# Patient Record
Sex: Male | Born: 1995 | Race: White | Hispanic: No | Marital: Single | Smoking: Former smoker
Health system: Southern US, Community
[De-identification: ages and names within clinical notes are randomized; demographics above are authoritative.]

## PROBLEM LIST (undated history)

## (undated) DIAGNOSIS — Z8669 Personal history of other diseases of the nervous system and sense organs: Secondary | ICD-10-CM

## (undated) DIAGNOSIS — E669 Obesity, unspecified: Secondary | ICD-10-CM

## (undated) DIAGNOSIS — S83512A Sprain of anterior cruciate ligament of left knee, initial encounter: Secondary | ICD-10-CM

## (undated) DIAGNOSIS — M25552 Pain in left hip: Secondary | ICD-10-CM

## (undated) DIAGNOSIS — M25551 Pain in right hip: Secondary | ICD-10-CM

## (undated) DIAGNOSIS — J302 Other seasonal allergic rhinitis: Secondary | ICD-10-CM

## (undated) DIAGNOSIS — Z8709 Personal history of other diseases of the respiratory system: Secondary | ICD-10-CM

## (undated) DIAGNOSIS — J189 Pneumonia, unspecified organism: Secondary | ICD-10-CM

## (undated) DIAGNOSIS — J45909 Unspecified asthma, uncomplicated: Secondary | ICD-10-CM

## (undated) HISTORY — DX: Pain in left hip: M25.552

## (undated) HISTORY — DX: Personal history of other diseases of the nervous system and sense organs: Z86.69

## (undated) HISTORY — DX: Personal history of other diseases of the respiratory system: Z87.09

## (undated) HISTORY — DX: Other seasonal allergic rhinitis: J30.2

## (undated) HISTORY — DX: Obesity, unspecified: E66.9

## (undated) HISTORY — DX: Sprain of anterior cruciate ligament of left knee, initial encounter: S83.512A

## (undated) HISTORY — DX: Pain in right hip: M25.551

---
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
## 2014-04-15 ENCOUNTER — Emergency Department: Payer: Self-pay | Admitting: Internal Medicine

## 2014-04-25 ENCOUNTER — Emergency Department: Payer: Self-pay | Admitting: Emergency Medicine

## 2015-01-13 ENCOUNTER — Emergency Department
Admit: 2015-01-13 | Disposition: A | Discharge status: Home / Self Care | Payer: Self-pay | Admitting: Emergency Medicine

## 2015-01-25 ENCOUNTER — Other Ambulatory Visit: Payer: Self-pay | Admitting: Specialist

## 2015-01-25 DIAGNOSIS — S8392XD Sprain of unspecified site of left knee, subsequent encounter: Secondary | ICD-10-CM

## 2015-01-28 NOTE — Op Note (Signed)
PATIENT NAME:  Gregory CooleySHANNON, Jhon W MR#:  [id number] DATE OF BIRTH:  [birth year]  DATE OF PROCEDURE:  11/05/2011  PREOPERATIVE DIAGNOSIS: Pilonidal cyst with abscess.   POSTOPERATIVE DIAGNOSIS: Pilonidal cyst with abscess.   OPERATIVE PROCEDURE: Pilonidal cystectomy with primary closure.   SURGEON: Donnalee CurryJeffrey Evania Lyne, MD   ANESTHESIA: Attended local under Dr. Noralyn Pickarroll, 0.5% Xylocaine with 0.25% Marcaine with 1: 200,000 units of epinephrine and sodium bicarbonate: 65 mL local infiltration.   ESTIMATED BLOOD LOSS: Minimal.   CLINICAL NOTE: This 19 year old male had previously undergone incision and drainage of a pilonidal abscess with retrieval of a large amount of hair. The acute infection has become quiescent and he is brought to the operating room at this time for planned excision of the area.   OPERATIVE NOTE: With the patient comfortably prone on the operating table and the area clipped, the butt cheeks were taped apart and the area prepped with Betadine and draped. Field block anesthesia was established and well-tolerated. There were four central pits in the natal cleft. Initial plan was to excise these individually, but there appeared to be significant chronic inflammatory tissue beneath this, and it was elected to remove the entire area through a 2.5-cm incision.   The area was debrided and excellent healthy tissue was noted. Hemostasis was with electrocautery. The adipose layer was approximated in multiple layers with 2-0 Vicryl figure-of-eight sutures. The skin was approximated with interrupted 4-0 Vicryl subcuticular sutures. Benzoin, Steri-Strips, Telfa, and Tegaderm dressing was then applied.          The patient tolerated the procedure well and was taken to the recovery room in stable condition.     ____________________________ Earline MayotteJeffrey W. Peace Noyes, MD jwb:bjt D: 11/05/2011 08:28:17 ET T: 11/05/2011 09:11:02 ET JOB#: [id number]  cc: Earline MayotteJeffrey W. Sender Rueb, MD, <Dictator> Charlton AmorHillary N.  Carroll, MD Santos Sollenberger Brion AlimentW Bettye Sitton MD ELECTRONICALLY SIGNED 11/06/2011 16:01

## 2015-01-28 NOTE — Op Note (Signed)
PATIENT NAME:  Selina CooleySHANNON, Kalib W MR#:  [id number] DATE OF BIRTH:  April 14, 1996  DATE OF PROCEDURE:  01/06/2012  PREOPERATIVE DIAGNOSIS: Recurrent pilonidal cyst and abscess.   POSTOPERATIVE DIAGNOSIS: Recurrent pilonidal cyst and abscess.   OPERATIVE PROCEDURE: Excision of pilonidal cyst.   OPERATING SURGEON: Earline MayotteJeffrey W. Nakaiya Beddow, MD   ANESTHESIA: Attended local, 65 mL 0.5% Xylocaine with 0.25% Marcaine with 1:200,000 units of epinephrine.   ESTIMATED BLOOD LOSS: Less than 5 mL.   CLINICAL NOTE: This 19 year old male had previously undergone excision of a pilonidal cyst at the end of January. He has had poor healing. He is brought to the operating room for planned re-excision.   OPERATIVE NOTE: He had Invanz 1 gram intravenously on entering the operating room. Hair was removed with clippers. Field block anesthesia was established and well tolerated after preparation of the skin with Betadine. An elliptical incision was used to remove the original biopsy site. This was extended down almost to the sacral fascia. There was a small area of inflammatory tissue superiorly that may have been overlooked at the original procedure. The area was cleansed and good hemostasis achieved with electrocautery. The deep adipose tissue was freed from the underlying fascia and approximated with interrupted 3-0 Vicryl figure-of-eight sutures. The adipose tissue was closed in layers. The skin was approximated with interrupted 3-0 Vicryl subcuticular sutures and then final approximation completed with interrupted 4-0 Prolene horizontal mattress sutures. A dry dressing with Telfa and Tegaderm was applied and the patient was taken to the recovery room in stable condition.   ____________________________ Earline MayotteJeffrey W. Armanda Forand, MD jwb:drc D: 01/06/2012 11:50:18 ET T: 01/06/2012 14:33:46 ET JOB#: [id number] cc: Earline MayotteJeffrey W. Anniyah Mood, MD, <Dictator>, Herb GraysYun Boylston, MD Delaynee Alred Brion AlimentW Michayla Mcneil MD ELECTRONICALLY SIGNED 01/08/2012 7:36

## 2015-02-05 ENCOUNTER — Other Ambulatory Visit: Payer: Self-pay | Admitting: Specialist

## 2015-02-05 DIAGNOSIS — T1590XS Foreign body on external eye, part unspecified, unspecified eye, sequela: Secondary | ICD-10-CM

## 2015-02-06 ENCOUNTER — Ambulatory Visit
Admission: RE | Admit: 2015-02-06 | Discharge: 2015-02-06 | Disposition: A | Discharge status: Home / Self Care | Payer: PRIVATE HEALTH INSURANCE | Source: Ambulatory Visit | Attending: Specialist | Admitting: Specialist

## 2015-02-06 DIAGNOSIS — S83412D Sprain of medial collateral ligament of left knee, subsequent encounter: Secondary | ICD-10-CM | POA: Insufficient documentation

## 2015-02-06 DIAGNOSIS — S83512D Sprain of anterior cruciate ligament of left knee, subsequent encounter: Secondary | ICD-10-CM | POA: Insufficient documentation

## 2015-02-06 DIAGNOSIS — T1590XS Foreign body on external eye, part unspecified, unspecified eye, sequela: Secondary | ICD-10-CM

## 2015-02-06 DIAGNOSIS — S8002XD Contusion of left knee, subsequent encounter: Secondary | ICD-10-CM | POA: Insufficient documentation

## 2015-02-06 DIAGNOSIS — M25462 Effusion, left knee: Secondary | ICD-10-CM | POA: Insufficient documentation

## 2015-02-06 DIAGNOSIS — S83242D Other tear of medial meniscus, current injury, left knee, subsequent encounter: Secondary | ICD-10-CM | POA: Insufficient documentation

## 2015-02-06 DIAGNOSIS — S8392XD Sprain of unspecified site of left knee, subsequent encounter: Secondary | ICD-10-CM

## 2015-05-10 ENCOUNTER — Ambulatory Visit (INDEPENDENT_AMBULATORY_CARE_PROVIDER_SITE_OTHER): Payer: PRIVATE HEALTH INSURANCE | Admitting: Family Medicine

## 2015-05-10 ENCOUNTER — Encounter: Payer: Self-pay | Admitting: Family Medicine

## 2015-05-10 VITALS — BP 138/78 | HR 86 | Temp 98.5°F | Resp 17 | Ht 69.0 in | Wt 213.3 lb

## 2015-05-10 DIAGNOSIS — M25559 Pain in unspecified hip: Secondary | ICD-10-CM | POA: Insufficient documentation

## 2015-05-10 DIAGNOSIS — S83519A Sprain of anterior cruciate ligament of unspecified knee, initial encounter: Secondary | ICD-10-CM | POA: Insufficient documentation

## 2015-05-10 DIAGNOSIS — E669 Obesity, unspecified: Secondary | ICD-10-CM | POA: Insufficient documentation

## 2015-05-10 DIAGNOSIS — L0591 Pilonidal cyst without abscess: Secondary | ICD-10-CM

## 2015-05-10 DIAGNOSIS — J302 Other seasonal allergic rhinitis: Secondary | ICD-10-CM | POA: Insufficient documentation

## 2015-05-10 DIAGNOSIS — Z720 Tobacco use: Secondary | ICD-10-CM | POA: Insufficient documentation

## 2015-05-10 MED ORDER — SULFAMETHOXAZOLE-TRIMETHOPRIM 800-160 MG PO TABS: 1.0000 | ORAL_TABLET | Freq: Two times a day (BID) | ORAL | Status: DC

## 2015-05-10 NOTE — Patient Instructions (Signed)
Pilonidal Cyst A pilonidal cyst occurs when hairs get trapped (ingrown) beneath the skin in the crease between the buttocks over your sacrum (the bone under that crease). Pilonidal cysts are most common in young men with a lot of body hair. When the cyst is ruptured (breaks) or leaking, fluid from the cyst may cause burning and itching. If the cyst becomes infected, it causes a painful swelling filled with pus (abscess). The pus and trapped hairs need to be removed (often by lancing) so that the infection can heal. However, recurrence is common and an operation may be needed to remove the cyst. HOME CARE INSTRUCTIONS   If the cyst was NOT INFECTED:  Keep the area clean and dry. Bathe or shower daily. Wash the area well with a germ-killing soap. Warm tub baths may help prevent infection and help with drainage. Dry the area well with a towel.  Avoid tight clothing to keep area as moisture free as possible.  Keep area between buttocks as free of hair as possible. A depilatory may be used.  If the cyst WAS INFECTED and needed to be drained:  Your caregiver packed the wound with gauze to keep the wound open. This allows the wound to heal from the inside outwards and continue draining.  Return for a wound check in 1 day or as suggested.  If you take tub baths or showers, repack the wound with gauze following them. Sponge baths (at the sink) are a good alternative.  If an antibiotic was ordered to fight the infection, take as directed.  Only take over-the-counter or prescription medicines for pain, discomfort, or fever as directed by your caregiver.  After the drain is removed, use sitz baths for 20 minutes 4 times per day. Clean the wound gently with mild unscented soap, pat dry, and then apply a dry dressing. SEEK MEDICAL CARE IF:   You have increased pain, swelling, redness, drainage, or bleeding from the area.  You have a fever.  You have muscles aches, dizziness, or a general ill  feeling. Document Released: 09/19/2000 Document Revised: 12/15/2011 Document Reviewed: 11/17/2008 ExitCare Patient Information 2015 [employer]. This information is not intended to replace advice given to you by your health care provider. Make sure you discuss any questions you have with your health care provider.  

## 2015-05-10 NOTE — Progress Notes (Signed)
Name: Gregory Chen   MRN: [id number]    DOB: [birth year]   Date:05/10/2015       Progress Note  Subjective  Chief Complaint  Chief Complaint  Patient presents with  . Referral    Cyst on buttock    HPI  Gregory Chen is a 19 year old male here today with symptoms of re-occuring pilonidal cyst infection. He has had this problem for many years on and off and has had surgical attention to the area twice. Other family members have the same issue. He noted drainage and tenderness a few days ago located at the lower back in between his gluteal region. No fevers, chills, pelvic pain.  Patient Active Problem List   Diagnosis Date Noted  . Allergic rhinitis, seasonal 05/10/2015  . Arthralgia of hip 05/10/2015  . Adiposity 05/10/2015  . ACL (anterior cruciate ligament) rupture 05/10/2015  . Current tobacco use 05/10/2015  . Sacrococcygeal pilonidal cyst 05/10/2015    History  Substance Use Topics  . Smoking status: Light Tobacco Smoker  . Smokeless tobacco: Current User    Types: Snuff, Chew  . Alcohol Use: 0.0 oz/week    0 Standard drinks or equivalent per week     Comment: ocassional    No current outpatient prescriptions on file.  Past Surgical History  Procedure Laterality Date  . Pilonidal cyst excision  2012    outpatient, Dr. Lemar Livings (twice)  . Circumcision revision  2003    pediatric  . Myringotomy with tube placement  1997    Family History  Problem Relation Age of Onset  . Allergies Mother     hay fever  . Migraines Mother   . Hypertension Mother   . Diabetes Father   . Alcohol abuse Father   . Depression Father   . Allergies Father     hay fever  . Hypertension Father   . Kidney disease Father   . Bipolar disorder Father     on dad's side  . Diabetes Maternal Grandmother   . Diabetes Maternal Grandfather   . Diabetes Paternal Grandmother   . Diabetes Paternal Grandfather     No Known Allergies   Review of Systems  CONSTITUTIONAL: No significant  weight changes, fever, chills, weakness or fatigue.  HEENT:  - Eyes: No visual changes.  - Ears: No auditory changes. No pain.  - Nose: No sneezing, congestion, runny nose. - Throat: No sore throat. No changes in swallowing. SKIN: Recurrent lesion. CARDIOVASCULAR: No chest pain, chest pressure or chest discomfort. No palpitations or edema.  RESPIRATORY: No shortness of breath, cough or sputum.  GASTROINTESTINAL: No anorexia, nausea, vomiting. No changes in bowel habits. No abdominal pain or blood.  GENITOURINARY: No dysuria. No frequency. No discharge. NEUROLOGICAL: No headache, dizziness, syncope, paralysis, ataxia, numbness or tingling in the extremities. No memory changes. No change in bowel or bladder control.  MUSCULOSKELETAL: No joint pain. No muscle pain. HEMATOLOGIC: No anemia, bleeding or bruising.  LYMPHATICS: No enlarged lymph nodes.  PSYCHIATRIC: No change in mood. No change in sleep pattern.  ENDOCRINOLOGIC: No reports of sweating, cold or heat intolerance. No polyuria or polydipsia.     Objective  BP 138/78 mmHg  Pulse 86  Temp(Src) 98.5 F (36.9 C) (Oral)  Resp 17  Ht  (1.753 m)  Wt 213 lb 4.8 oz (96.752 kg)  BMI 31.48 kg/m2  SpO2 97% Body mass index is 31.48 kg/(m^2).  Physical Exam  Constitutional: Patient appears well-developed and well-nourished. In no  distress.  Cardiovascular: Normal rate, regular rhythm and normal heart sounds.  No murmur heard.  Pulmonary/Chest: Effort normal and breath sounds normal. No respiratory distress. Musculoskeletal: Normal range of motion bilateral UE and LE, no joint effusions. Peripheral vascular: Bilateral LE no edema. Neurological: CN II-XII grossly intact with no focal deficits. Alert and oriented to person, place, and time. Coordination, balance, strength, speech and gait are normal.  Skin: Skin is warm and dry. At the sacrococcygeal region there is a notable sinus tract with purulent drainage, surround skin mild  erythema without fluctuance or induration. Psychiatric: Patient has a normal mood and affect. Behavior is normal in office today. Judgment and thought content normal in office today.   No results found for this or any previous visit (from the past 2160 hour(s)).   Assessment & Plan  1. Sacrococcygeal pilonidal cyst Spontaneously draining will not I&D today, keep area clean, avoid tight clothing, start Bactrim DS for broad spectrum coverage and coverage of MRSA. Referred back to [facility].  - sulfamethoxazole-trimethoprim (BACTRIM DS,SEPTRA DS) 800-160 MG per tablet; Take 1 tablet by mouth every 12 (twelve) hours.  Dispense: 20 tablet; Refill: 0 - Ambulatory referral to General Surgery

## 2015-05-14 ENCOUNTER — Encounter: Payer: Self-pay | Admitting: General Surgery

## 2015-05-15 ENCOUNTER — Ambulatory Visit (INDEPENDENT_AMBULATORY_CARE_PROVIDER_SITE_OTHER): Payer: PRIVATE HEALTH INSURANCE | Admitting: General Surgery

## 2015-05-15 ENCOUNTER — Encounter: Payer: Self-pay | Admitting: General Surgery

## 2015-05-15 VITALS — BP 120/78 | HR 84 | Resp 16 | Ht 69.0 in | Wt 211.0 lb

## 2015-05-15 DIAGNOSIS — L0591 Pilonidal cyst without abscess: Secondary | ICD-10-CM | POA: Diagnosis not present

## 2015-05-15 NOTE — Patient Instructions (Signed)
Patient is scheduled for surgery at [facility] on 05/31/15. He will pre admit by phone. Patient is aware of date and instructions.

## 2015-05-15 NOTE — Progress Notes (Signed)
Patient ID: Gregory Chen, male DOB: 05/24/1996, 18 y.o. MRN: [id number]  Chief Complaint   Patient presents with   .  Other     infected pilinidal cyst    HPI  Gregory Chen is a 18 y.o. male here today for a evaluation of a infected pilonidal cyst. Patient states he noticed this area about two weeks ago. He states the area is tender and draining. He has had this problem for many years on and off since surgical intervention on 11/05/2011 when he presented with a pilonidal cyst with abscess which was excised followed by primary closure. A fairly limited resection was undertaken at that time. .  HPI  Past Medical History   Diagnosis  Date   .  Obesity, Class I, BMI 30.0-34.9 (see actual BMI)    .  Allergic rhinitis, seasonal    .  Hip pain, bilateral    .  Rupture of anterior cruciate ligament of left knee      sequela   .  H/O otitis media    .  H/O status asthmaticus     Past Surgical History   Procedure  Laterality  Date   .  Pilonidal cyst excision   2012     outpatient, Dr. Elery Cadenhead (twice)   .  Circumcision revision   2003     pediatric   .  Myringotomy with tube placement   1997    Family History   Problem  Relation  Age of Onset   .  Allergies  Mother      hay fever   .  Migraines  Mother    .  Hypertension  Mother    .  Diabetes  Father    .  Alcohol abuse  Father    .  Depression  Father    .  Allergies  Father      hay fever   .  Hypertension  Father    .  Kidney disease  Father    .  Bipolar disorder  Father      on dad's side   .  Diabetes  Maternal Grandmother    .  Diabetes  Maternal Grandfather    .  Diabetes  Paternal Grandmother    .  Diabetes  Paternal Grandfather     Social History  Social History   Substance Use Topics   .  Smoking status:  Light Tobacco Smoker   .  Smokeless tobacco:  Current User     Types:  Snuff, Chew   .  Alcohol Use:  0.0 oz/week     0 Standard drinks or equivalent per week      Comment: ocassional    No Known  Allergies  Current Outpatient Prescriptions   Medication  Sig  Dispense  Refill   .  sulfamethoxazole-trimethoprim (BACTRIM DS,SEPTRA DS) 800-160 MG per tablet  Take 1 tablet by mouth every 12 (twelve) hours.  20 tablet  0    No current facility-administered medications for this visit.    Review of Systems  Review of Systems  Constitutional: Negative.  Respiratory: Negative.  Cardiovascular: Negative.   Blood pressure 120/78, pulse 84, resp. rate 16, height 5' 9" (1.753 m), weight 211 lb (95.709 kg).  Physical Exam  Physical Exam  Constitutional: He appears well-developed and well-nourished.  HENT:  Head: Normocephalic and atraumatic.  Eyes: Conjunctivae are normal.  Neck: Neck supple. No thyromegaly present.  Cardiovascular: Normal rate and regular rhythm.    Pulmonary/Chest: Effort normal and breath sounds normal.  Musculoskeletal:  Back:   Data Reviewed  Operative report of 11/05/2011 at which time a limited resection through a 2.5 cm incision was undertaken. Pathology showed inflamed skin with cystic space consistent with a pilonidal cyst.  Operative report of 01/06/2012 for poor healing showed dissection was completed down to the sacral fascia. Primary closure again undertaken.  Assessment   Recurrent pilonidal cyst disease.   Plan   Gregory Chen cyst excision with rotation flap coverage. The role of a postoperative drain was reviewed with the patient and his mother.   Patient is scheduled for surgery at [facility] on 05/31/15. He will pre admit by phone. Patient is aware of date and instructions.  PCP: Gregory Chen, Gregory Chen  Gregory Chen  05/16/2015, 11:37 AM     

## 2015-05-16 NOTE — H&P (Signed)
Patient ID: Gregory Chen, male DOB: 12-06-1995, 19 y.o. MRN: [id number]  Chief Complaint   Patient presents with   .  Other     infected pilinidal cyst    HPI  Gregory Chen is a 19 y.o. male here today for a evaluation of a infected pilonidal cyst. Patient states he noticed this area about two weeks ago. He states the area is tender and draining. He has had this problem for many years on and off since surgical intervention on 11/05/2011 when he presented with a pilonidal cyst with abscess which was excised followed by primary closure. A fairly limited resection was undertaken at that time. Marland Kitchen  HPI  Past Medical History   Diagnosis  Date   .  Obesity, Class I, BMI 30.0-34.9 (see actual BMI)    .  Allergic rhinitis, seasonal    .  Hip pain, bilateral    .  Rupture of anterior cruciate ligament of left knee      sequela   .  H/O otitis media    .  H/O status asthmaticus     Past Surgical History   Procedure  Laterality  Date   .  Pilonidal cyst excision   2012     outpatient, Dr. Lemar Livings (twice)   .  Circumcision revision   2003     pediatric   .  Myringotomy with tube placement   1997    Family History   Problem  Relation  Age of Onset   .  Allergies  Mother      hay fever   .  Migraines  Mother    .  Hypertension  Mother    .  Diabetes  Father    .  Alcohol abuse  Father    .  Depression  Father    .  Allergies  Father      hay fever   .  Hypertension  Father    .  Kidney disease  Father    .  Bipolar disorder  Father      on dad's side   .  Diabetes  Maternal Grandmother    .  Diabetes  Maternal Grandfather    .  Diabetes  Paternal Grandmother    .  Diabetes  Paternal Grandfather     Social History  Social History   Substance Use Topics   .  Smoking status:  Light Tobacco Smoker   .  Smokeless tobacco:  Current User     Types:  Snuff, Chew   .  Alcohol Use:  0.0 oz/week     0 Standard drinks or equivalent per week      Comment: ocassional    No Known  Allergies  Current Outpatient Prescriptions   Medication  Sig  Dispense  Refill   .  sulfamethoxazole-trimethoprim (BACTRIM DS,SEPTRA DS) 800-160 MG per tablet  Take 1 tablet by mouth every 12 (twelve) hours.  20 tablet  0    No current facility-administered medications for this visit.    Review of Systems  Review of Systems  Constitutional: Negative.  Respiratory: Negative.  Cardiovascular: Negative.   Blood pressure 120/78, pulse 84, resp. rate 16, height 5\' 9"  (1.753 m), weight 211 lb (95.709 kg).  Physical Exam  Physical Exam  Constitutional: He appears well-developed and well-nourished.  HENT:  Head: Normocephalic and atraumatic.  Eyes: Conjunctivae are normal.  Neck: Neck supple. No thyromegaly present.  Cardiovascular: Normal rate and regular rhythm.  Pulmonary/Chest: Effort normal and breath sounds normal.  Musculoskeletal:  Back:   Data Reviewed  Operative report of 11/05/2011 at which time a limited resection through a 2.5 cm incision was undertaken. Pathology showed inflamed skin with cystic space consistent with a pilonidal cyst.  Operative report of 01/06/2012 for poor healing showed dissection was completed down to the sacral fascia. Primary closure again undertaken.  Assessment   Recurrent pilonidal cyst disease.   Plan   [name] cyst excision with rotation flap coverage. The role of a postoperative drain was reviewed with the patient and his mother.   Patient is scheduled for surgery at [facility] on 05/31/15. He will pre admit by phone. Patient is aware of date and instructions.  PCP: Dimitri Ped  05/16/2015, 11:37 AM

## 2015-05-24 ENCOUNTER — Other Ambulatory Visit: Payer: PRIVATE HEALTH INSURANCE

## 2015-05-25 ENCOUNTER — Telehealth: Payer: Self-pay

## 2015-05-25 ENCOUNTER — Encounter: Payer: Self-pay | Admitting: *Deleted

## 2015-05-25 NOTE — Telephone Encounter (Signed)
Patient called and needed to reschedule his surgery scheduled for 05/31/15 due to scheduling problems at work. The patient is now scheduled or surgery at [facility] on 06/15/15. Patient is aware of date and instructions.

## 2015-05-25 NOTE — Patient Instructions (Signed)
  Your procedure is scheduled on: 05/31/15 Thurs Report to Day Surgery. To find out your arrival time please call [phone] between 1PM - 3PM on 05/30/15 Wed.  Remember: Instructions that are not followed completely may result in serious medical risk, up to and including death, or upon the discretion of your surgeon and anesthesiologist your surgery may need to be rescheduled.    __x__ 1. Do not eat food or drink liquids after midnight. No gum chewing or hard candies.     __x__ 2. No Alcohol for 24 hours before or after surgery.   ____ 3. Bring all medications with you on the day of surgery if instructed.    _x___ 4. Notify your doctor if there is any change in your medical condition     (cold, fever, infections).     Do not wear jewelry, make-up, hairpins, clips or nail polish.  Do not wear lotions, powders, or perfumes. You may wear deodorant.  Do not shave 48 hours prior to surgery. Men may shave face and neck.  Do not bring valuables to the hospital.    [facility] is not responsible for any belongings or valuables.               Contacts, dentures or bridgework may not be worn into surgery.  Leave your suitcase in the car. After surgery it may be brought to your room.  For patients admitted to the hospital, discharge time is determined by your                treatment team.   Patients discharged the day of surgery will not be allowed to drive home.   Please read over the following fact sheets that you were given:      _x___ Take these medicines the morning of surgery with A SIP OF WATER:    1.None  2.   3.   4.  5.  6.  ____ Fleet Enema (as directed)   _x___ Use CHG Soap as directed Sage wipes day of surgery  ____ Use inhalers on the day of surgery  ____ Stop metformin 2 days prior to surgery    ____ Take 1/2 of usual insulin dose the night before surgery and none on the morning of surgery.   ____ Stop Coumadin/Plavix/aspirin on   ____ Stop  Anti-inflammatories on    ____ Stop supplements until after surgery.    ____ Bring C-Pap to the hospital.

## 2015-06-15 ENCOUNTER — Encounter: Payer: Self-pay | Admitting: *Deleted

## 2015-06-15 ENCOUNTER — Encounter
Admission: RE | Disposition: A | Discharge status: Home / Self Care | Payer: Self-pay | Source: Ambulatory Visit | Attending: General Surgery

## 2015-06-15 ENCOUNTER — Ambulatory Visit
Admission: RE | Admit: 2015-06-15 | Discharge: 2015-06-15 | Disposition: A | Discharge status: Home / Self Care | Payer: PRIVATE HEALTH INSURANCE | Source: Ambulatory Visit | Attending: General Surgery | Admitting: General Surgery

## 2015-06-15 ENCOUNTER — Ambulatory Visit: Payer: PRIVATE HEALTH INSURANCE | Admitting: Anesthesiology

## 2015-06-15 DIAGNOSIS — J45909 Unspecified asthma, uncomplicated: Secondary | ICD-10-CM | POA: Diagnosis not present

## 2015-06-15 DIAGNOSIS — F17210 Nicotine dependence, cigarettes, uncomplicated: Secondary | ICD-10-CM | POA: Insufficient documentation

## 2015-06-15 DIAGNOSIS — L0591 Pilonidal cyst without abscess: Secondary | ICD-10-CM | POA: Diagnosis not present

## 2015-06-15 HISTORY — PX: PILONIDAL CYST EXCISION: SHX744

## 2015-06-15 HISTORY — DX: Pneumonia, unspecified organism: J18.9

## 2015-06-15 HISTORY — DX: Unspecified asthma, uncomplicated: J45.909

## 2015-06-15 SURGERY — EXCISION, PILONIDAL CYST, EXTENSIVE
Anesthesia: General | Wound class: Clean Contaminated

## 2015-06-15 MED ORDER — LIDOCAINE HCL (CARDIAC) 20 MG/ML IV SOLN
INTRAVENOUS | Status: DC | PRN
  Administered 2015-06-15: 50 mg via INTRAVENOUS

## 2015-06-15 MED ORDER — FAMOTIDINE 20 MG PO TABS
ORAL_TABLET | ORAL | Status: AC
  Administered 2015-06-15: 20 mg via ORAL
  Filled 2015-06-15: qty 1

## 2015-06-15 MED ORDER — GLYCOPYRROLATE 0.2 MG/ML IJ SOLN
INTRAMUSCULAR | Status: DC | PRN
Start: 2015-06-15 — End: 2015-06-15
  Administered 2015-06-15: 0.4 mg via INTRAVENOUS

## 2015-06-15 MED ORDER — SODIUM CHLORIDE 0.9 % IR SOLN
Status: DC | PRN
  Administered 2015-06-15: 100 mL

## 2015-06-15 MED ORDER — ONDANSETRON HCL 4 MG/2ML IJ SOLN
INTRAMUSCULAR | Status: DC | PRN
Start: 2015-06-15 — End: 2015-06-15
  Administered 2015-06-15: 4 mg via INTRAVENOUS

## 2015-06-15 MED ORDER — LIDOCAINE-EPINEPHRINE (PF) 1 %-1:200000 IJ SOLN
INTRAMUSCULAR | Status: DC | PRN
  Administered 2015-06-15: 30 mL

## 2015-06-15 MED ORDER — MIDAZOLAM HCL 2 MG/2ML IJ SOLN
INTRAMUSCULAR | Status: DC | PRN
  Administered 2015-06-15: 2 mg via INTRAVENOUS

## 2015-06-15 MED ORDER — FENTANYL CITRATE (PF) 100 MCG/2ML IJ SOLN
INTRAMUSCULAR | Status: DC | PRN
  Administered 2015-06-15 (×2): 50 ug via INTRAVENOUS
  Administered 2015-06-15: 100 ug via INTRAVENOUS
  Administered 2015-06-15: 50 ug via INTRAVENOUS

## 2015-06-15 MED ORDER — PROPOFOL 10 MG/ML IV BOLUS
INTRAVENOUS | Status: DC | PRN
  Administered 2015-06-15: 150 mg via INTRAVENOUS

## 2015-06-15 MED ORDER — FENTANYL CITRATE (PF) 100 MCG/2ML IJ SOLN
25.0000 ug | INTRAMUSCULAR | Status: DC | PRN
  Administered 2015-06-15 (×4): 25 ug via INTRAVENOUS

## 2015-06-15 MED ORDER — ROCURONIUM BROMIDE 100 MG/10ML IV SOLN
INTRAVENOUS | Status: DC | PRN
  Administered 2015-06-15: 50 mg via INTRAVENOUS

## 2015-06-15 MED ORDER — HYDROMORPHONE HCL 1 MG/ML IJ SOLN: 0.2500 mg | INTRAMUSCULAR | Status: DC | PRN

## 2015-06-15 MED ORDER — DEXAMETHASONE SODIUM PHOSPHATE 4 MG/ML IJ SOLN
INTRAMUSCULAR | Status: DC | PRN
  Administered 2015-06-15: 5 mg via INTRAVENOUS

## 2015-06-15 MED ORDER — HYDROCODONE-ACETAMINOPHEN 5-325 MG PO TABS: 1.0000 | ORAL_TABLET | ORAL | Status: DC | PRN

## 2015-06-15 MED ORDER — ACETAMINOPHEN 10 MG/ML IV SOLN
INTRAVENOUS | Status: AC
  Filled 2015-06-15: qty 100

## 2015-06-15 MED ORDER — ACETAMINOPHEN 10 MG/ML IV SOLN
INTRAVENOUS | Status: DC | PRN
  Administered 2015-06-15: 1000 mg via INTRAVENOUS

## 2015-06-15 MED ORDER — BUPIVACAINE-EPINEPHRINE (PF) 0.5% -1:200000 IJ SOLN
INTRAMUSCULAR | Status: AC
  Filled 2015-06-15: qty 30

## 2015-06-15 MED ORDER — SODIUM CHLORIDE 0.9 % IV SOLN
1.0000 g | INTRAVENOUS | Status: AC
  Administered 2015-06-15: 1 g via INTRAVENOUS
  Filled 2015-06-15: qty 1

## 2015-06-15 MED ORDER — LIDOCAINE-EPINEPHRINE (PF) 1 %-1:200000 IJ SOLN
INTRAMUSCULAR | Status: AC
  Filled 2015-06-15: qty 30

## 2015-06-15 MED ORDER — LACTATED RINGERS IV SOLN
INTRAVENOUS | Status: DC
  Administered 2015-06-15: 11:00:00 via INTRAVENOUS

## 2015-06-15 MED ORDER — ONDANSETRON HCL 4 MG/2ML IJ SOLN: 4.0000 mg | Freq: Once | INTRAMUSCULAR | Status: DC | PRN

## 2015-06-15 MED ORDER — FAMOTIDINE 20 MG PO TABS
20.0000 mg | ORAL_TABLET | Freq: Once | ORAL | Status: AC
  Administered 2015-06-15: 20 mg via ORAL

## 2015-06-15 MED ORDER — BUPIVACAINE-EPINEPHRINE (PF) 0.5% -1:200000 IJ SOLN
INTRAMUSCULAR | Status: DC | PRN
  Administered 2015-06-15: 30 mL via PERINEURAL

## 2015-06-15 MED ORDER — FENTANYL CITRATE (PF) 100 MCG/2ML IJ SOLN
INTRAMUSCULAR | Status: AC
  Administered 2015-06-15: 25 ug via INTRAVENOUS
  Filled 2015-06-15: qty 2

## 2015-06-15 MED ORDER — KETOROLAC TROMETHAMINE 30 MG/ML IJ SOLN
INTRAMUSCULAR | Status: DC | PRN
  Administered 2015-06-15: 30 mg via INTRAVENOUS

## 2015-06-15 MED ORDER — NEOSTIGMINE METHYLSULFATE 10 MG/10ML IV SOLN
INTRAVENOUS | Status: DC | PRN
  Administered 2015-06-15: 3 mg via INTRAVENOUS

## 2015-06-15 SURGICAL SUPPLY — 32 items
BLADE SURG 11 STRL SS SAFETY (MISCELLANEOUS) ×3 IMPLANT
BLADE SURG 15 STRL SS SAFETY (BLADE) ×3 IMPLANT
BULB RESERV EVAC DRAIN JP 100C (MISCELLANEOUS) ×3 IMPLANT
CANISTER SUCT 1200ML W/VALVE (MISCELLANEOUS) ×3 IMPLANT
CLOSURE WOUND 1/2 X4 (GAUZE/BANDAGES/DRESSINGS)
DRAIN CHANNEL JP 15F RND 16 (MISCELLANEOUS) ×3 IMPLANT
DRAPE LAPAROTOMY 100X77 ABD (DRAPES) ×3 IMPLANT
DRSG TEGADERM 4X4.75 (GAUZE/BANDAGES/DRESSINGS) ×12 IMPLANT
DRSG TELFA 3X8 NADH (GAUZE/BANDAGES/DRESSINGS) ×6 IMPLANT
GLOVE BIO SURGEON STRL SZ7.5 (GLOVE) ×12 IMPLANT
GLOVE INDICATOR 8.0 STRL GRN (GLOVE) ×12 IMPLANT
GOWN STRL REUS W/ TWL LRG LVL3 (GOWN DISPOSABLE) ×4 IMPLANT
GOWN STRL REUS W/TWL LRG LVL3 (GOWN DISPOSABLE) ×8
KIT RM TURNOVER STRD PROC AR (KITS) ×3 IMPLANT
LABEL OR SOLS (LABEL) IMPLANT
NDL SAFETY 22GX1.5 (NEEDLE) ×3 IMPLANT
NEEDLE HYPO 25X1 1.5 SAFETY (NEEDLE) ×3 IMPLANT
NS IRRIG 500ML POUR BTL (IV SOLUTION) ×3 IMPLANT
PACK BASIN MINOR ARMC (MISCELLANEOUS) ×3 IMPLANT
PAD GROUND ADULT SPLIT (MISCELLANEOUS) ×3 IMPLANT
SOL PREP PVP 2OZ (MISCELLANEOUS) ×3
SOLUTION PREP PVP 2OZ (MISCELLANEOUS) ×1 IMPLANT
STRIP CLOSURE SKIN 1/2X4 (GAUZE/BANDAGES/DRESSINGS) IMPLANT
SUT ETHILON 3 0 PS 1 (SUTURE) ×3 IMPLANT
SUT PROLENE 4-0 (SUTURE) ×2
SUT PROLENE 4-0 RB1 30XMFL BLU (SUTURE) ×1
SUT VIC AB 2-0 CT2 27 (SUTURE) ×12 IMPLANT
SUT VICRYL 3-0 27IN (SUTURE) ×3 IMPLANT
SUT VICRYL+ 3-0 144IN (SUTURE) ×3 IMPLANT
SUTURE PROLEN 4-0 RB1 30XMFL (SUTURE) ×1 IMPLANT
SWABSTK COMLB BENZOIN TINCTURE (MISCELLANEOUS) ×3 IMPLANT
SYR CONTROL 10ML (SYRINGE) ×3 IMPLANT

## 2015-06-15 NOTE — Anesthesia Preprocedure Evaluation (Signed)
Anesthesia Evaluation  Patient identified by MRN, date of birth, ID band Patient awake    Reviewed: Allergy & Precautions, H&P , NPO status , Patient's Chart, lab work & pertinent test results, reviewed documented beta blocker date and time   Airway Mallampati: II  TM Distance: >3 FB Neck ROM: full    Dental  (+) Teeth Intact   Pulmonary neg pulmonary ROS, asthma , pneumonia, resolved, Current Smoker,    Pulmonary exam normal        Cardiovascular  Rhythm:regular Rate:Normal     Neuro/Psych    GI/Hepatic   Endo/Other    Renal/GU      Musculoskeletal   Abdominal   Peds  Hematology   Anesthesia Other Findings   Reproductive/Obstetrics                             Anesthesia Physical Anesthesia Plan  ASA: II  Anesthesia Plan: General ETT   Post-op Pain Management:    Induction:   Airway Management Planned:   Additional Equipment:   Intra-op Plan:   Post-operative Plan:   Informed Consent: I have reviewed the patients History and Physical, chart, labs and discussed the procedure including the risks, benefits and alternatives for the proposed anesthesia with the patient or authorized representative who has indicated his/her understanding and acceptance.     Plan Discussed with: CRNA  Anesthesia Plan Comments:         Anesthesia Quick Evaluation

## 2015-06-15 NOTE — Anesthesia Procedure Notes (Signed)
Procedure Name: Intubation Date/Time: 06/15/2015 11:50 AM Performed by: Omer Jack Pre-anesthesia Checklist: Patient identified, Patient being monitored, Timeout performed, Emergency Drugs available and Suction available Patient Re-evaluated:Patient Re-evaluated prior to inductionOxygen Delivery Method: Circle system utilized Preoxygenation: Pre-oxygenation with 100% oxygen Intubation Type: IV induction Ventilation: Mask ventilation without difficulty Laryngoscope Size: Mac and 3 Grade View: Grade II Tube type: Oral Tube size: 7.5 mm Number of attempts: 1 Placement Confirmation: ETT inserted through vocal cords under direct vision,  positive ETCO2 and breath sounds checked- equal and bilateral Secured at: 21 cm Tube secured with: Tape Dental Injury: Teeth and Oropharynx as per pre-operative assessment

## 2015-06-15 NOTE — Op Note (Signed)
Preoperative diagnosis: Recurrent pilonidal cyst.  Postoperative diagnosis: Same.  Operative procedure: Pilonidal cyst excision with rotation flap coverage.  Surgeon: Marlan Palau, M.D.  Anesthesia: Gen. endotracheal; 0.5% Xylocaine with 0.25% Marcaine with 1-200,000 units of epinephrine: 60 mL local infiltration.  History of blood loss 15 mL.  Clinical note: This 19 year old male underwent excision of pilonidal cyst in spring of 2013 on 2 occasions. She's developed recurrent symptoms. It was elected to procedure reexcision and rotation flap coverage to minimize risk of recurrence. The patient received Invanz prior to the procedure.  Operative note: The patient underwent general endotracheal anesthesia was rolled to the prone position with appropriate padding. The buttocks were taped apart and the area prepped with Betadine solution and draped. The area was previously marked to indicate the edges of the normal skin flaps. Local antiseptic was infiltrated for postoperative comfort and hemostasis. The midline skin was excised and 1 cm wide band. The single draining sinus extended directl anteriorly to the presacral fascia. No evidence of abscess. The skin of the right side of the Botox was mobilized for 5 cm and a fairly thick, 8 mm flap. The tapes were released and the adipose layer approximated in multiple layers with interrupted 2-0 Vicryls sutures. The area where the incision had been swept laterally at the 3:00 position as viewed from the foot of the bed was approximated with interrupted 2-0 Vicryls sutures. The skin was closed with a running 3-0 Vicryls subcutaneous suture. Prior to wound closure a 15-gauge Blake drain was placed in the base of the wound, brought out through a separate stab wound incision and anchored in position with 3-0 nylon suture. Benzoin and Steri-Strips followed by Telfa and Tegaderm dressings were applied.  The patient will return to the spine position, awakened and  taken to recovery in stable condition.

## 2015-06-15 NOTE — Discharge Instructions (Signed)
AMBULATORY SURGERY  °DISCHARGE INSTRUCTIONS ° ° °1) The drugs that you were given will stay in your system until tomorrow so for the next 24 hours you should not: ° °A) Drive an automobile °B) Make any legal decisions °C) Drink any alcoholic beverage ° ° °2) You may resume regular meals tomorrow.  Today it is better to start with liquids and gradually work up to solid foods. ° °You may eat anything you prefer, but it is better to start with liquids, then soup and crackers, and gradually work up to solid foods. ° ° °3) Please notify your doctor immediately if you have any unusual bleeding, trouble breathing, redness and pain at the surgery site, drainage, fever, or pain not relieved by medication. ° ° ° °4) Additional Instructions: ° ° ° ° ° ° ° °Please contact your physician with any problems or Same Day Surgery at [phone], Monday through Friday 6 am to 4 pm, or [facility] number at [phone].AMBULATORY SURGERY  °DISCHARGE INSTRUCTIONS ° ° °5) The drugs that you were given will stay in your system until tomorrow so for the next 24 hours you should not: ° °D) Drive an automobile °E) Make any legal decisions °F) Drink any alcoholic beverage ° ° °6) You may resume regular meals tomorrow.  Today it is better to start with liquids and gradually work up to solid foods. ° °You may eat anything you prefer, but it is better to start with liquids, then soup and crackers, and gradually work up to solid foods. ° ° °7) Please notify your doctor immediately if you have any unusual bleeding, trouble breathing, redness and pain at the surgery site, drainage, fever, or pain not relieved by medication. ° ° ° °8) Additional Instructions: ° ° ° ° ° ° ° °Please contact your physician with any problems or Same Day Surgery at [phone], Monday through Friday 6 am to 4 pm, or [facility] number at [phone]. °

## 2015-06-15 NOTE — H&P (Signed)
Recurrent pilonidal cyst.  For excision and rotation flap coverage. No change in general health or exam.

## 2015-06-15 NOTE — Progress Notes (Signed)
5cc bloody drainage emptied from JP drain

## 2015-06-15 NOTE — Transfer of Care (Signed)
Immediate Anesthesia Transfer of Care Note  Patient: Gregory Chen  Procedure(s) Performed: Procedure(s): CYST EXCISION PILONIDAL with flap  (N/A)  Patient Location: PACU  Anesthesia Type:General  Level of Consciousness: sedated  Airway & Oxygen Therapy: Patient Spontanous Breathing  Post-op Assessment: Report given to RN and Post -op Vital signs reviewed and stable  Post vital signs: Reviewed and stable  Last Vitals:  Filed Vitals:   06/15/15 1313  BP: 126/67  Pulse: 64  Temp: 37.3 C  Resp: 18    Complications: No apparent anesthesia complications

## 2015-06-17 NOTE — Anesthesia Postprocedure Evaluation (Signed)
  Anesthesia Post-op Note  Patient: Gregory Chen  Procedure(s) Performed: Procedure(s): CYST EXCISION PILONIDAL with flap  (N/A)  Anesthesia type:General ETT  Patient location: PACU  Post pain: Pain level controlled  Post assessment: Post-op Vital signs reviewed, Patient's Cardiovascular Status Stable, Respiratory Function Stable, Patent Airway and No signs of Nausea or vomiting  Post vital signs: Reviewed and stable  Last Vitals:  Filed Vitals:   06/15/15 1356  BP: 111/71  Pulse: 50  Temp: 35.8 C  Resp: 16    Level of consciousness: awake, alert  and patient cooperative  Complications: No apparent anesthesia complications

## 2015-06-18 LAB — SURGICAL PATHOLOGY

## 2015-06-20 ENCOUNTER — Ambulatory Visit (INDEPENDENT_AMBULATORY_CARE_PROVIDER_SITE_OTHER): Payer: PRIVATE HEALTH INSURANCE | Admitting: General Surgery

## 2015-06-20 ENCOUNTER — Encounter: Payer: Self-pay | Admitting: General Surgery

## 2015-06-20 VITALS — BP 122/76 | HR 78 | Resp 14 | Ht 70.0 in | Wt 215.0 lb

## 2015-06-20 DIAGNOSIS — L0591 Pilonidal cyst without abscess: Secondary | ICD-10-CM

## 2015-06-20 NOTE — Patient Instructions (Signed)
The patient is aware to call back for any questions or concerns.  

## 2015-06-20 NOTE — Progress Notes (Addendum)
Patient ID: Gregory Chen, male   DOB: [birth year], 19 y.o.   MRN: [id number]  Chief Complaint  Patient presents with  . Routine Post Op    HPI Gregory Chen is a 19 y.o. male.  Here today for postoperative visit, pilonidal cyst on 06-15-15. States he is doing well, occasional nausea no vomiting.   HPI  Past Medical History  Diagnosis Date  . Obesity, Class I, BMI 30.0-34.9 (see actual BMI)   . Allergic rhinitis, seasonal   . Hip pain, bilateral   . Rupture of anterior cruciate ligament of left knee     sequela  . H/O otitis media   . H/O status asthmaticus   . Asthma   . Pneumonia     Past Surgical History  Procedure Laterality Date  . Pilonidal cyst excision  2012    outpatient, Dr. Lemar Livings (twice)  . Circumcision revision  2003    pediatric  . Myringotomy with tube placement  1997  . Pilonidal cyst excision N/A 06/15/2015    Procedure: CYST EXCISION PILONIDAL with flap ;  Surgeon: Earline Mayotte, MD;  Location: [facility] ORS;  Service: General;  Laterality: N/A;    Family History  Problem Relation Age of Onset  . Allergies Mother     hay fever  . Migraines Mother   . Hypertension Mother   . Diabetes Father   . Alcohol abuse Father   . Depression Father   . Allergies Father     hay fever  . Hypertension Father   . Kidney disease Father   . Bipolar disorder Father     on dad's side  . Diabetes Maternal Grandmother   . Diabetes Maternal Grandfather   . Diabetes Paternal Grandmother   . Diabetes Paternal Grandfather     Social History Social History  Substance Use Topics  . Smoking status: Light Tobacco Smoker -- 0.25 packs/day  . Smokeless tobacco: Current User    Types: Snuff, Chew  . Alcohol Use: 0.0 oz/week    0 Standard drinks or equivalent per week     Comment: ocassional    No Known Allergies  Current Outpatient Prescriptions  Medication Sig Dispense Refill  . HYDROcodone-acetaminophen (NORCO) 5-325 MG per tablet Take 1-2 tablets by mouth every  4 (four) hours as needed for moderate pain or severe pain. 30 tablet 0   No current facility-administered medications for this visit.    Review of Systems Review of Systems  Constitutional: Negative.   Respiratory: Negative.   Cardiovascular: Negative.   Gastrointestinal: Positive for nausea.    Blood pressure 122/76, pulse 78, resp. rate 14, height 5\' 10"  (1.778 m), weight 215 lb (97.523 kg).  Physical Exam Physical Exam  Constitutional: He is oriented to person, place, and time. He appears well-developed and well-nourished.  Genitourinary:  Small opening noted, drain removed.  Musculoskeletal:       Back:  Neurological: He is alert and oriented to person, place, and time.  Skin: Skin is warm and dry.  Psychiatric: His behavior is normal.    Data Reviewed  Surgical Pathology  CASE: [id number]  PATIENT: Gregory Chen  Surgical Pathology Report      SPECIMEN SUBMITTED:  A. Pilonidal cyst   CLINICAL HISTORY:  None provided   PRE-OPERATIVE DIAGNOSIS:  pilonidal cyst   POST-OPERATIVE DIAGNOSIS:  None provided      DIAGNOSIS:  A. PILONIDAL CYST; EXCISION:  - PILONIDAL CYST.  Assessment    Doing well status post pilonidal cyst excision and rotation flap coverage.    Plan         Follow up one week.  The patient is aware to call back for any questions or concerns.]  PCP:  Margo Aye, [place] 07/15/2015, 8:22 AM

## 2015-06-28 ENCOUNTER — Ambulatory Visit (INDEPENDENT_AMBULATORY_CARE_PROVIDER_SITE_OTHER): Payer: PRIVATE HEALTH INSURANCE | Admitting: General Surgery

## 2015-06-28 ENCOUNTER — Encounter: Payer: Self-pay | Admitting: General Surgery

## 2015-06-28 VITALS — BP 142/72 | HR 82 | Resp 12 | Ht 70.0 in | Wt 222.0 lb

## 2015-06-28 DIAGNOSIS — L0591 Pilonidal cyst without abscess: Secondary | ICD-10-CM

## 2015-06-28 NOTE — Progress Notes (Signed)
Patient ID: Gregory Chen, male   DOB: 06/15/1996, 19 y.o.   MRN: [id number]  Chief Complaint  Patient presents with  . Follow-up    pilonidal cyst    HPI Gregory Chen is a 19 y.o. male Here today for postoperative visit, pilonidal cyst on 06-15-15. He states the area is still draining. HPI  Past Medical History  Diagnosis Date  . Obesity, Class I, BMI 30.0-34.9 (see actual BMI)   . Allergic rhinitis, seasonal   . Hip pain, bilateral   . Rupture of anterior cruciate ligament of left knee     sequela  . H/O otitis media   . H/O status asthmaticus   . Asthma   . Pneumonia     Past Surgical History  Procedure Laterality Date  . Pilonidal cyst excision  2012    outpatient, Dr. Lemar Livings (twice)  . Circumcision revision  2003    pediatric  . Myringotomy with tube placement  1997  . Pilonidal cyst excision N/A 06/15/2015    Procedure: CYST EXCISION PILONIDAL with flap ;  Surgeon: Earline Mayotte, MD;  Location: [facility] ORS;  Service: General;  Laterality: N/A;    Family History  Problem Relation Age of Onset  . Allergies Mother     hay fever  . Migraines Mother   . Hypertension Mother   . Diabetes Father   . Alcohol abuse Father   . Depression Father   . Allergies Father     hay fever  . Hypertension Father   . Kidney disease Father   . Bipolar disorder Father     on dad's side  . Diabetes Maternal Grandmother   . Diabetes Maternal Grandfather   . Diabetes Paternal Grandmother   . Diabetes Paternal Grandfather     Social History Social History  Substance Use Topics  . Smoking status: Light Tobacco Smoker -- 0.25 packs/day  . Smokeless tobacco: Current User    Types: Snuff, Chew  . Alcohol Use: 0.0 oz/week    0 Standard drinks or equivalent per week     Comment: ocassional    No Known Allergies  Current Outpatient Prescriptions  Medication Sig Dispense Refill  . HYDROcodone-acetaminophen (NORCO) 5-325 MG per tablet Take 1-2 tablets by mouth every 4 (four)  hours as needed for moderate pain or severe pain. 30 tablet 0   No current facility-administered medications for this visit.    Review of Systems Review of Systems  Constitutional: Negative.   Respiratory: Negative.   Cardiovascular: Negative.     Blood pressure 142/72, pulse 82, resp. rate 12, height  (1.778 m), weight 222 lb (100.699 kg).  Physical Exam Physical Exam  Musculoskeletal:       Back:     Assessment    Doing well status post pilonidal excision. Residual focal area of granulation tissue without evidence of infection.    Plan    Patient was instructed in regards the use of topical agents to help resolve this local area of inflammation.   Patient to use medi honey twice a day. Follow-up examination in 3 weeks. PCP:  Alyson Reedy 06/29/2015, 9:39 PM

## 2015-06-28 NOTE — Patient Instructions (Signed)
Patient to use medi honey twice a day.

## 2015-07-03 NOTE — Addendum Note (Signed)
Addendum  created 07/03/15 0981 by Yevette Edwards, MD   Modules edited: Anesthesia Events, Narrator   Narrator:  Narrator: Event Log Edited

## 2015-07-18 ENCOUNTER — Encounter: Payer: Self-pay | Admitting: General Surgery

## 2015-07-18 ENCOUNTER — Ambulatory Visit (INDEPENDENT_AMBULATORY_CARE_PROVIDER_SITE_OTHER): Payer: PRIVATE HEALTH INSURANCE | Admitting: General Surgery

## 2015-07-18 VITALS — BP 120/72 | HR 82 | Resp 12 | Ht 70.0 in | Wt 237.0 lb

## 2015-07-18 DIAGNOSIS — L0591 Pilonidal cyst without abscess: Secondary | ICD-10-CM

## 2015-07-18 NOTE — Patient Instructions (Signed)
Patient to return as needed. 

## 2015-07-18 NOTE — Progress Notes (Signed)
Patient ID: Gregory Chen, male   DOB: 01-Jan-1996, 19 y.o.   MRN: [id number]  Chief Complaint  Patient presents with  . Follow-up    pilonidal cyst    HPI Gregory Chen is a 19 y.o. male Here today for postoperative visit, pilonidal cyst on 06-15-15. He states the area is still draining a little                                                         HPI  Past Medical History  Diagnosis Date  . Obesity, Class I, BMI 30.0-34.9 (see actual BMI)   . Allergic rhinitis, seasonal   . Hip pain, bilateral   . Rupture of anterior cruciate ligament of left knee     sequela  . H/O otitis media   . H/O status asthmaticus   . Asthma   . Pneumonia     Past Surgical History  Procedure Laterality Date  . Pilonidal cyst excision  2012    outpatient, Dr. Lemar LivingsByrnett (twice)  . Circumcision revision  2003    pediatric  . Myringotomy with tube placement  1997  . Pilonidal cyst excision N/A 06/15/2015    Procedure: CYST EXCISION PILONIDAL with flap ;  Surgeon: Earline MayotteJeffrey W Cheikh Bramble, MD;  Location: [facility] ORS;  Service: General;  Laterality: N/A;    Family History  Problem Relation Age of Onset  . Allergies Mother     hay fever  . Migraines Mother   . Hypertension Mother   . Diabetes Father   . Alcohol abuse Father   . Depression Father   . Allergies Father     hay fever  . Hypertension Father   . Kidney disease Father   . Bipolar disorder Father     on dad's side  . Diabetes Maternal Grandmother   . Diabetes Maternal Grandfather   . Diabetes Paternal Grandmother   . Diabetes Paternal Grandfather     Social History Social History  Substance Use Topics  . Smoking status: Light Tobacco Smoker -- 0.25 packs/day  . Smokeless tobacco: Current User    Types: Snuff, Chew  . Alcohol Use: 0.0 oz/week    0 Standard drinks or equivalent per week     Comment: ocassional    No Known Allergies  Current Outpatient Prescriptions  Medication Sig Dispense Refill  . HYDROcodone-acetaminophen (NORCO)  5-325 MG per tablet Take 1-2 tablets by mouth every 4 (four) hours as needed for moderate pain or severe pain. (Patient not taking: Reported on 07/18/2015) 30 tablet 0   No current facility-administered medications for this visit.    Review of Systems Review of Systems  Constitutional: Negative.   Respiratory: Negative.   Cardiovascular: Negative.     Blood pressure 120/72, pulse 82, resp. rate 12, height 5\' 10"  (1.778 m), weight 237 lb (107.502 kg).  Physical Exam Physical Exam  Musculoskeletal:       Back:   Examination shows completes healing of the pilonidal cystectomy site.  Data Reviewed No new data.    Assessment   doing well status post pilonidal cystectomy with rotation flap coverage.  Plan    Patient was encouraged to call if he has further problems. I expressed the inflammation at the area of the subcuticular sutures of the superior aspect of the wound will resolve  spontaneously.    Patient to return as needed.   PCP:  Alyson Reedy 07/18/2015, 9:23 PM

## 2015-10-22 IMAGING — CR DG CHEST 2V
1 series · 2 of 2 positions shown · non-contrast
Comparison: None.

CLINICAL DATA: Shortness of breath and cough for 1-1/2 to 2 weeks.

EXAM:
CHEST  2 VIEW

[Series 1: pa · 0.17mm/px · 2 of 2 slices shown]
[im 1/2]
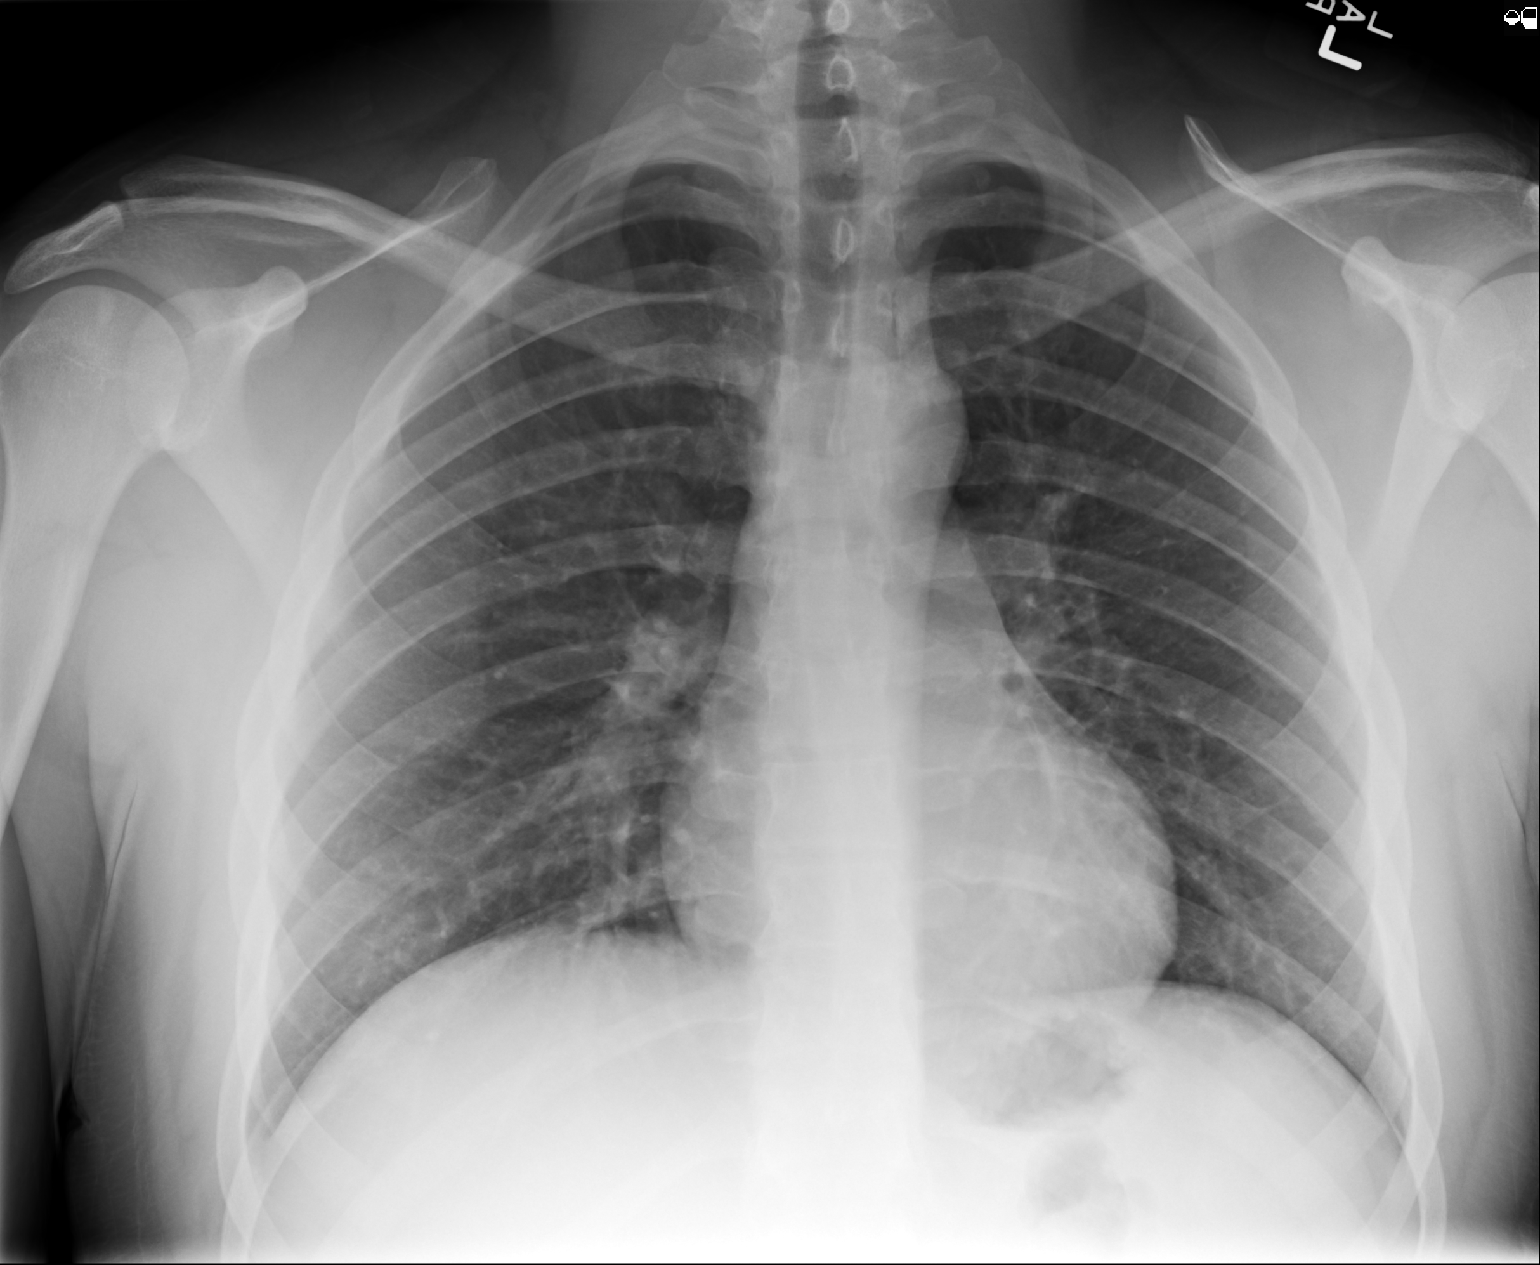
[im 2/2]
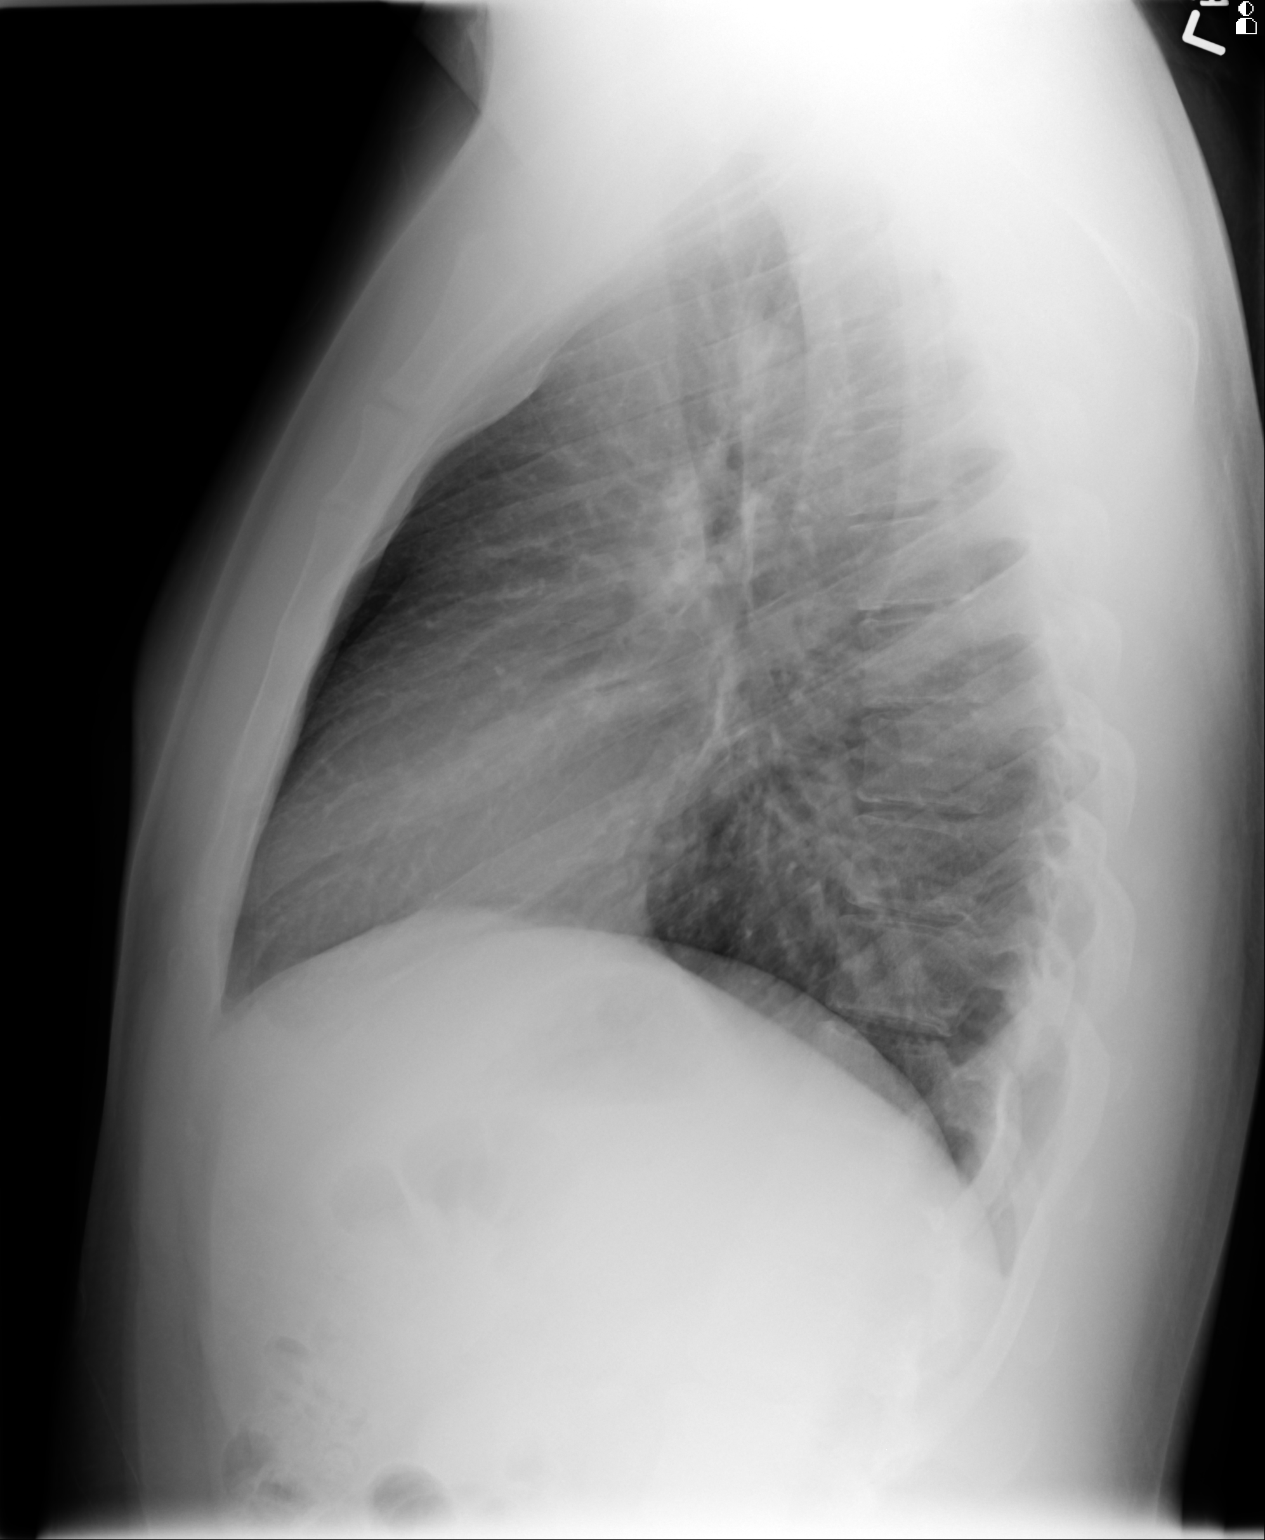

[2 of 2 positions shown; findings below may reference images not displayed]

FINDINGS: Heart size and mediastinal contours are within normal limits. Both
lungs are clear. Visualized skeletal structures are unremarkable.
IMPRESSION: Negative exam.

## 2016-06-04 ENCOUNTER — Ambulatory Visit (INDEPENDENT_AMBULATORY_CARE_PROVIDER_SITE_OTHER): Payer: PRIVATE HEALTH INSURANCE | Admitting: Family Medicine

## 2016-06-04 ENCOUNTER — Encounter: Payer: Self-pay | Admitting: Family Medicine

## 2016-06-04 VITALS — BP 128/76 | HR 79 | Temp 98.5°F | Resp 16 | Ht 70.0 in | Wt 247.8 lb

## 2016-06-04 DIAGNOSIS — F40241 Acrophobia: Secondary | ICD-10-CM | POA: Diagnosis not present

## 2016-06-04 DIAGNOSIS — Z23 Encounter for immunization: Secondary | ICD-10-CM

## 2016-06-04 DIAGNOSIS — F918 Other conduct disorders: Secondary | ICD-10-CM | POA: Diagnosis not present

## 2016-06-04 DIAGNOSIS — F411 Generalized anxiety disorder: Secondary | ICD-10-CM

## 2016-06-04 DIAGNOSIS — F40218 Other animal type phobia: Secondary | ICD-10-CM | POA: Diagnosis not present

## 2016-06-04 DIAGNOSIS — F40240 Claustrophobia: Secondary | ICD-10-CM | POA: Diagnosis not present

## 2016-06-04 DIAGNOSIS — R454 Irritability and anger: Secondary | ICD-10-CM

## 2016-06-04 NOTE — Progress Notes (Signed)
Name: Gregory Chen   MRN: [id number]    DOB: Jul 29, 1996   Date:06/04/2016       Progress Note  Subjective  Chief Complaint  Chief Complaint  Patient presents with  . Panic Attack    Patient has had 3 seat belt tickets in the past month due to claustrophobia. Patient has frequent pain attacks when in a tight space or wearing a seatbelt.      HPI  GAD: he came in today because he wanted a note to not be able to wear a seat belt. Explained the importance of therapy and medication use. He has multiple phobias and GAD, anger issues. He states all he wants to have a note to not wear the seat-belt. Explained that seat-belt saves lives.  He got upset about it.   Patient Active Problem List   Diagnosis Date Noted  . Allergic rhinitis, seasonal 05/10/2015  . Arthralgia of hip 05/10/2015  . Obesity (BMI 30-39.9) 05/10/2015  . ACL (anterior cruciate ligament) rupture 05/10/2015  . Current tobacco use 05/10/2015    Past Surgical History:  Procedure Laterality Date  . CIRCUMCISION REVISION  2003   pediatric  . MYRINGOTOMY WITH TUBE PLACEMENT  1997  . PILONIDAL CYST EXCISION  2012   outpatient, Dr. Lemar LivingsByrnett (twice)  . PILONIDAL CYST EXCISION N/A 06/15/2015   Procedure: CYST EXCISION PILONIDAL with flap ;  Surgeon: Earline MayotteJeffrey W Byrnett, MD;  Location: [facility] ORS;  Service: General;  Laterality: N/A;    Family History  Problem Relation Age of Onset  . Allergies Mother     hay fever  . Migraines Mother   . Hypertension Mother   . Diabetes Father   . Alcohol abuse Father   . Depression Father   . Allergies Father     hay fever  . Hypertension Father   . Kidney disease Father   . Bipolar disorder Father     on dad's side  . Diabetes Maternal Grandmother   . Diabetes Maternal Grandfather   . Diabetes Paternal Grandmother   . Diabetes Paternal Grandfather     Social History   Social History  . Marital status: Single    Spouse name: N/A  . Number of children: N/A  . Years of  education: N/A   Occupational History  .  Acc    [employer]   Social History Main Topics  . Smoking status: Former Smoker    Packs/day: 0.25    Years: 3.00    Start date: 05/17/2013    Quit date: 04/22/2016  . Smokeless tobacco: Former NeurosurgeonUser    Types: Snuff, Chew    Quit date: 05/29/2015  . Alcohol use 0.0 oz/week     Comment: ocassional  . Drug use:     Types: Marijuana     Comment: uses marijuana  . Sexual activity: Yes    Partners: Female   Other Topics Concern  . Not on file   Social History Narrative  . No narrative on file    No current outpatient prescriptions on file.  No Known Allergies   ROS  Ten systems reviewed and is negative except as mentioned in HPI   Objective  Vitals:   06/04/16 0941  BP: 128/76  Pulse: 79  Resp: 16  Temp: 98.5 F (36.9 C)  TempSrc: Oral  SpO2: 98%  Weight: 247 lb 12.8 oz (112.4 kg)  Height: 5\' 10"  (1.778 m)    Body mass index is 35.56 kg/m.  Physical Exam  Constitutional:  Patient appears well-developed and well-nourished. Obese  No distress.  HEENT: head atraumatic, normocephalic, pupils equal and reactive to light,  neck supple, throat within normal limits Cardiovascular: Normal rate, regular rhythm and normal heart sounds.  No murmur heard. No BLE edema. Pulmonary/Chest: Effort normal and breath sounds normal. No respiratory distress. Abdominal: Soft.  There is no tenderness. Psychiatric: Patient has a normal mood and affect. behavior is normal. Judgment and thought content normal.  PHQ2/9: Depression screen [facility] 2/9 06/04/2016 05/10/2015  Decreased Interest 0 0  Down, Depressed, Hopeless 0 0  PHQ - 2 Score 0 0     Fall Risk: Fall Risk  06/04/2016 05/10/2015  Falls in the past year? No No     Functional Status Survey: Is the patient deaf or have difficulty hearing?: No Does the patient have difficulty seeing, even when wearing glasses/contacts?: No Does the patient have difficulty concentrating, remembering, or  making decisions?: No Does the patient have difficulty walking or climbing stairs?: No Does the patient have difficulty dressing or bathing?: No Does the patient have difficulty doing errands alone such as visiting a doctor's office or shopping?: No    Assessment & Plan  1. GAD (generalized anxiety disorder)  Discussed starting medication, but he refuses also states insurance will not pay for a therapist, advised him to contact his insurance. 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 Claustrophobia   5. Unable to control anger

## 2016-08-12 ENCOUNTER — Ambulatory Visit: Payer: PRIVATE HEALTH INSURANCE | Admitting: Family Medicine

## 2016-08-14 IMAGING — MR MR KNEE*L* W/O CM
6 series · 40 of 40 positions shown · non-contrast
Comparison: Plain films left knee 01/13/2015.

CLINICAL DATA: Status post fall 1 month ago with a twisting injury
of the left knee. Diffuse left knee pain and difficulty
weight-bearing. Subsequent encounter.

EXAM:
MRI OF THE LEFT KNEE WITHOUT CONTRAST
TECHNIQUE: Multiplanar, multisequence MR imaging of the knee was performed. No
intravenous contrast was administered.

[Series 3: PD fat-sat · axial · 3.0mm · 0.62mm/px · z∈[-62,+50]mm · 8 of 35 slices shown (1 of 4)]
[im 1/35]
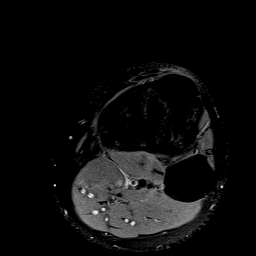
[im 5/35]
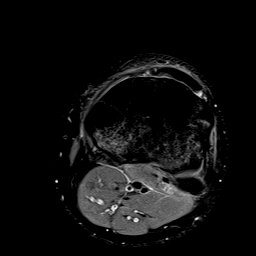
[im 10/35]
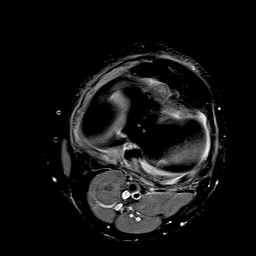
[im 15/35]
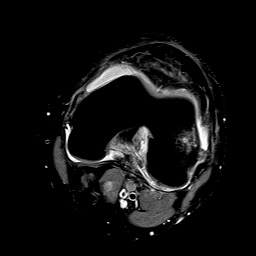
[im 20/35]
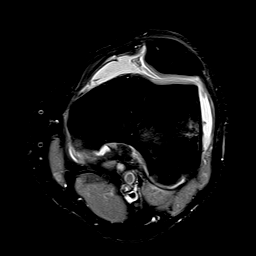
[im 25/35]
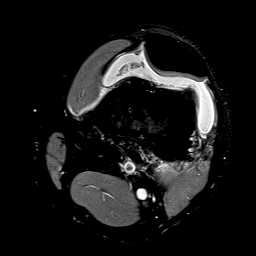
[im 30/35]
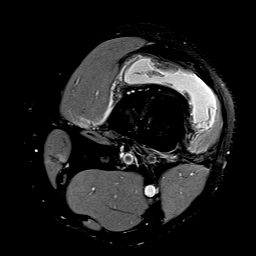
[im 35/35]
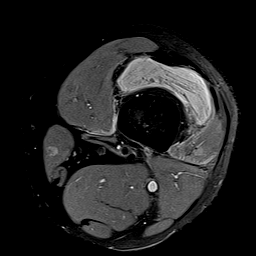

[Series 4: T1 · coronal · 3.0mm · 0.62mm/px · 8 of 35 slices shown]
[im 1/35]
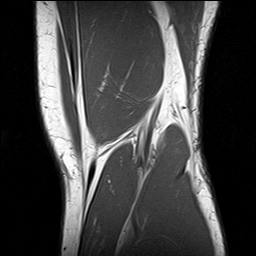
[im 5/35]
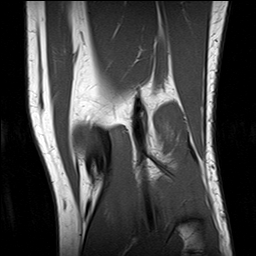
[im 10/35]
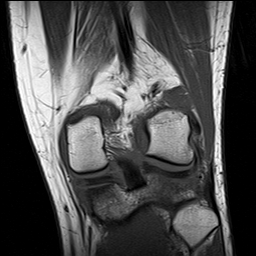
[im 15/35]
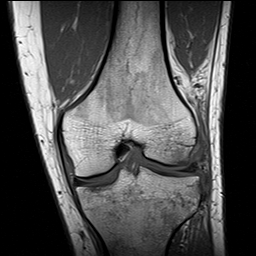
[im 20/35]
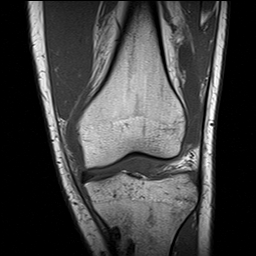
[im 25/35]
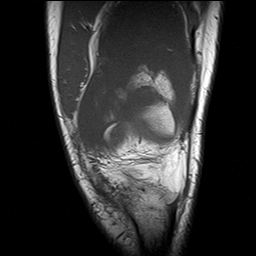
[im 30/35]
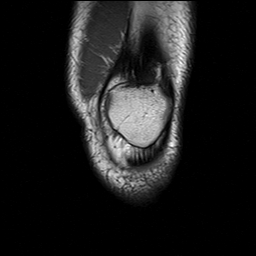
[im 35/35]
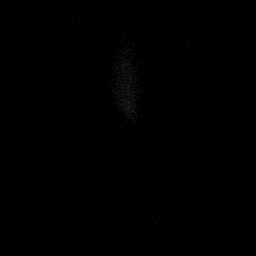

[Series 5: T2 fat-sat · coronal · 3.0mm · 0.31mm/px · 7 of 35 slices shown]
[im 1/35]
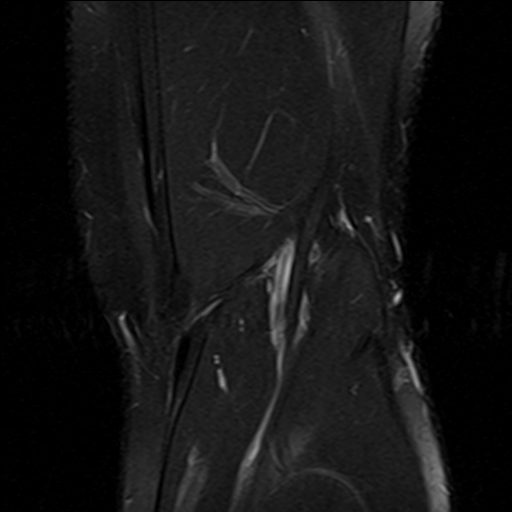
[im 6/35]
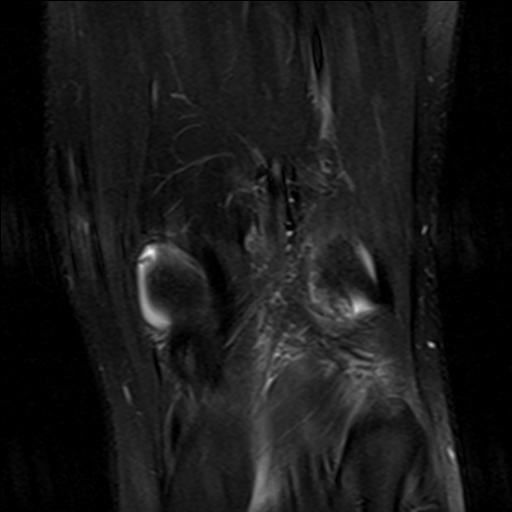
[im 12/35]
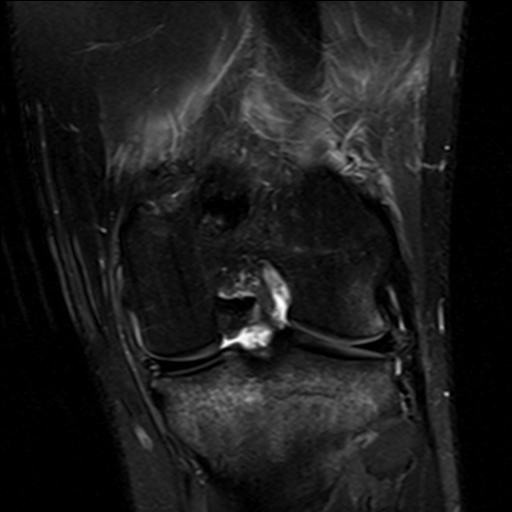
[im 18/35]
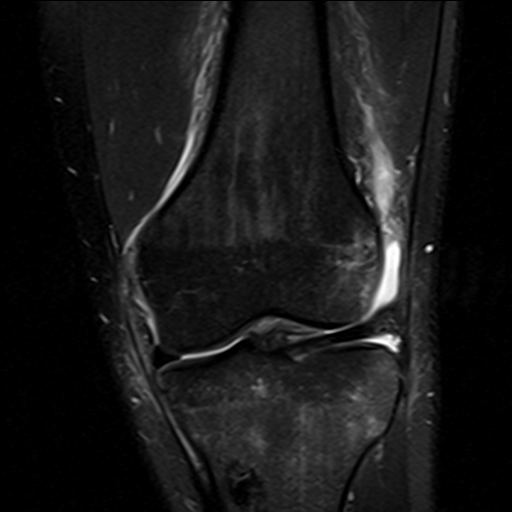
[im 23/35]
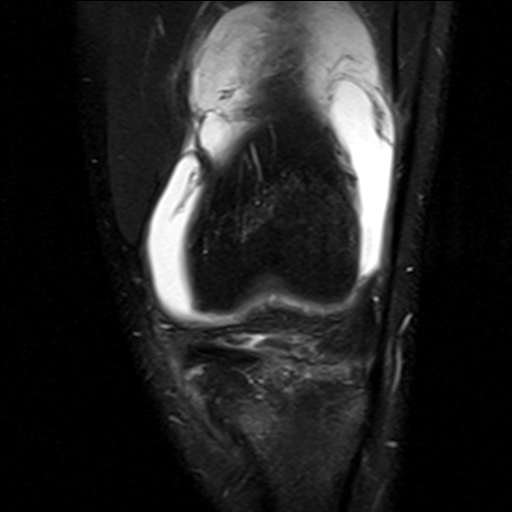
[im 29/35]
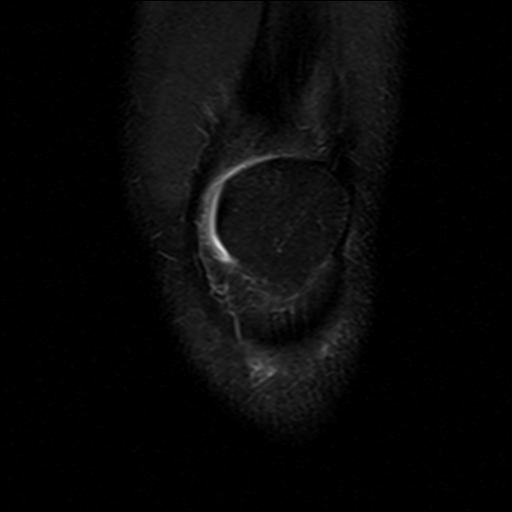
[im 35/35]
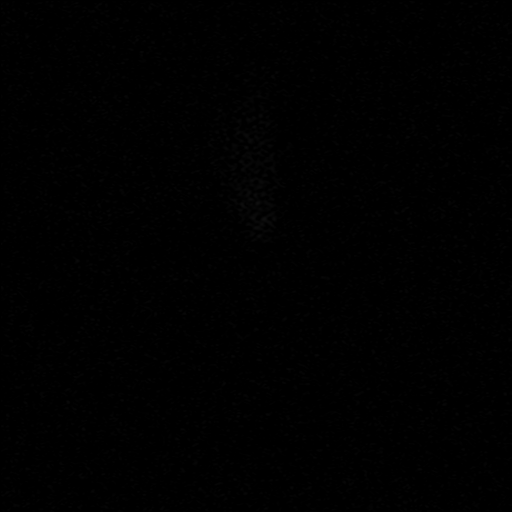

[Series 6: PD fat-sat · coronal · 3.0mm · 0.62mm/px · 7 of 35 slices shown (2 of 4)]
[im 1/35]
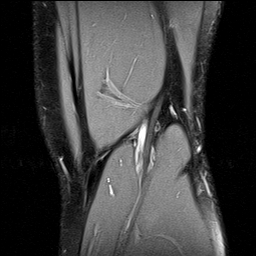
[im 6/35]
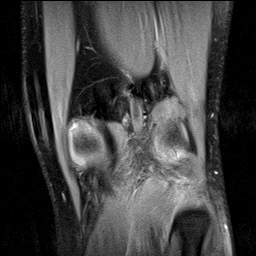
[im 12/35]
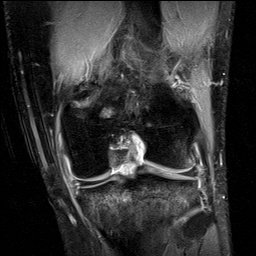
[im 18/35]
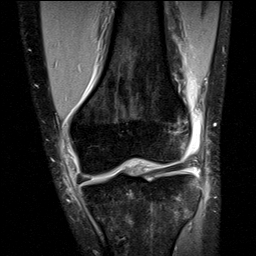
[im 23/35]
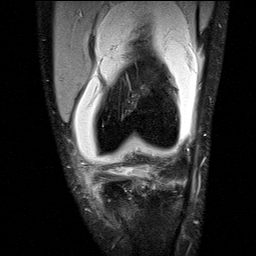
[im 29/35]
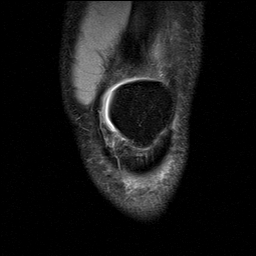
[im 35/35]
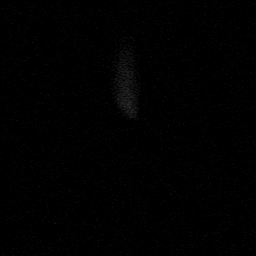

[Series 7: PD fat-sat · sagittal · 3.0mm · 0.62mm/px · 7 of 35 slices shown (3 of 4)]
[im 1/35]
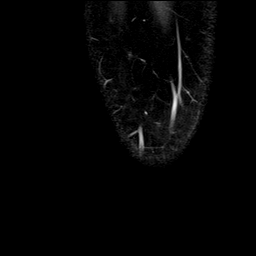
[im 6/35]
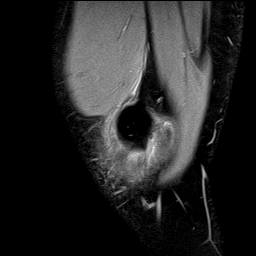
[im 12/35]
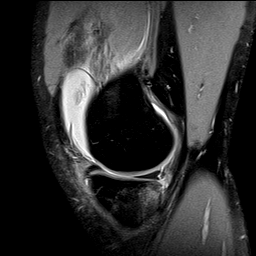
[im 18/35]
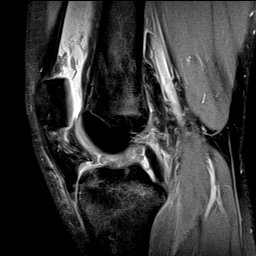
[im 23/35]
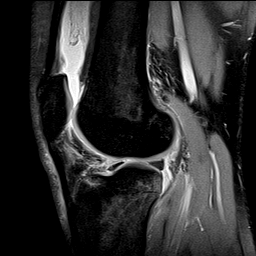
[im 29/35]
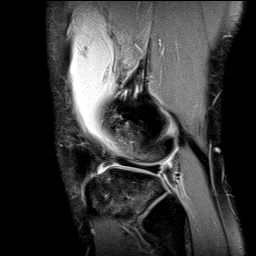
[im 35/35]
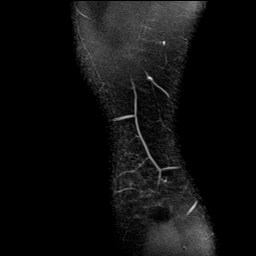

[Series 9: PD fat-sat · oblique · 2.0mm · 0.62mm/px · 3 of 15 slices shown (4 of 4)]
[im 1/15]
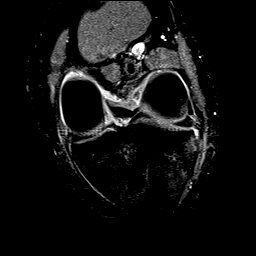
[im 8/15]
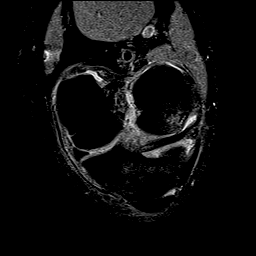
[im 15/15]
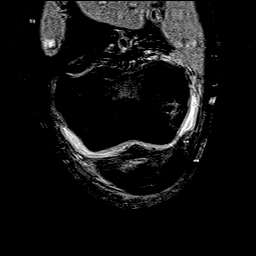

[40 of 40 positions shown; findings below may reference images not displayed]

FINDINGS: MENISCI

Medial meniscus: There is a longitudinal tear in the periphery of
the posterior horn of the medial meniscus without displaced
fragment.

Lateral meniscus:  Intact.

LIGAMENTS

Cruciates: The anterior cruciate ligament is completely torn. The
posterior cruciate ligament is intact.

Collaterals: Edema is seen about an intact medial collateral
ligament consistent with grade 1 sprain. The lateral collateral
ligament complex is intact.

CARTILAGE

Patellofemoral:  Unremarkable.

Medial:  Unremarkable.

Lateral:  Unremarkable.

Joint: Moderate joint effusion contains some debris likely
representing hemorrhage.

Popliteal Fossa:  No Baker's cyst.

Extensor Mechanism:  Intact.

Bones: Extensive marrow edema about the knee is worst in the
posterior tibia and consistent with bone contusions. Small chip
fracture of the tibia seen on the prior plain films is not visible
on this examination.
IMPRESSION: This study is positive for a complete ACL tear with associated joint
effusion and bone contusions.

Longitudinal tear periphery of the posterior horn of the medial
meniscus.

Grade 1 MCL sprain without tear.

Small chip fracture posterior tibia seen on comparison plain film is
not visible on this exam.

## 2018-01-01 ENCOUNTER — Emergency Department: Payer: 59

## 2018-01-01 DIAGNOSIS — J45909 Unspecified asthma, uncomplicated: Secondary | ICD-10-CM | POA: Insufficient documentation

## 2018-01-01 DIAGNOSIS — S62306A Unspecified fracture of fifth metacarpal bone, right hand, initial encounter for closed fracture: Secondary | ICD-10-CM | POA: Diagnosis not present

## 2018-01-01 DIAGNOSIS — W2209XA Striking against other stationary object, initial encounter: Secondary | ICD-10-CM | POA: Diagnosis not present

## 2018-01-01 DIAGNOSIS — Y999 Unspecified external cause status: Secondary | ICD-10-CM | POA: Diagnosis not present

## 2018-01-01 DIAGNOSIS — Y939 Activity, unspecified: Secondary | ICD-10-CM | POA: Insufficient documentation

## 2018-01-01 DIAGNOSIS — Y929 Unspecified place or not applicable: Secondary | ICD-10-CM | POA: Insufficient documentation

## 2018-01-01 DIAGNOSIS — S6991XA Unspecified injury of right wrist, hand and finger(s), initial encounter: Secondary | ICD-10-CM | POA: Diagnosis present

## 2018-01-01 DIAGNOSIS — Z87891 Personal history of nicotine dependence: Secondary | ICD-10-CM | POA: Insufficient documentation

## 2018-01-01 NOTE — ED Triage Notes (Signed)
Patient punched a door frame. Patient c/o pain to right hand. Swelling noted to area.

## 2018-01-01 NOTE — ED Notes (Signed)
Patient transported to X-ray 

## 2018-01-02 ENCOUNTER — Emergency Department
Admission: EM | Admit: 2018-01-02 | Discharge: 2018-01-02 | Disposition: A | Discharge status: Home / Self Care | Payer: 59 | Attending: Emergency Medicine | Admitting: Emergency Medicine

## 2018-01-02 DIAGNOSIS — S62339A Displaced fracture of neck of unspecified metacarpal bone, initial encounter for closed fracture: Secondary | ICD-10-CM

## 2018-01-02 MED ORDER — HYDROCODONE-ACETAMINOPHEN 5-325 MG PO TABS: 1.0000 | ORAL_TABLET | Freq: Four times a day (QID) | ORAL | 0 refills | Status: AC | PRN

## 2018-01-02 MED ORDER — BUPIVACAINE HCL (PF) 0.5 % IJ SOLN
INTRAMUSCULAR | Status: AC
  Administered 2018-01-02: 30 mL
  Filled 2018-01-02: qty 30

## 2018-01-02 MED ORDER — BUPIVACAINE HCL 0.5 % IJ SOLN
30.0000 mL | Freq: Once | INTRAMUSCULAR | Status: AC
  Administered 2018-01-02: 30 mL
  Filled 2018-01-02: qty 30

## 2018-01-02 NOTE — Discharge Instructions (Signed)
Please keep your splint on at all times until cleared by orthopedic surgery.  Take your pain medication as needed for severe symptoms and return to the emergency department for any concerns.  It was a pleasure to take care of you today, and thank you for coming to our emergency department.  If you have any questions or concerns before leaving please ask the nurse to grab me and I'm more than happy to go through your aftercare instructions again.  If you were prescribed any opioid pain medication today such as Norco, Vicodin, Percocet, morphine, hydrocodone, or oxycodone please make sure you do not drive when you are taking this medication as it can alter your ability to drive safely.  If you have any concerns once you are home that you are not improving or are in fact getting worse before you can make it to your follow-up appointment, please do not hesitate to call 911 and come back for further evaluation.  Merrily Brittle, MD  Results for orders placed or performed during the hospital encounter of 06/15/15  Surgical pathology  Result Value Ref Range   SURGICAL PATHOLOGY      Surgical Pathology CASE: [id number] PATIENT: Gregory Chen Surgical Pathology Report     SPECIMEN SUBMITTED: A. Pilonidal cyst  CLINICAL HISTORY: None provided  PRE-OPERATIVE DIAGNOSIS: pilonidal cyst  POST-OPERATIVE DIAGNOSIS: None provided     DIAGNOSIS: A. PILONIDAL CYST; EXCISION: - PILONIDAL CYST.   GROSS DESCRIPTION: A. Labeled: Pilonidal cyst Tissue Fragment(s): 1 Measurement: 7.8 x 1.3 x 3.2 cm Comment: Ellipse of pale tan skin and soft tissue which shows a linear 3 defect that measures 5 cm in length, portions of which extends into the underlying soft tissue. The cut surface reveals a cystic spaces measuring up to 0.6 cm in diameter, containing pink red soft material and hair.  Representative sections submitted in cassette(s): 1    Final Diagnosis performed by Glenice Bow, MD.   Electronically signed 06/18/2015 12:25:41PM    The electronic signature indicates that the named Attending Pathologist has evaluated the specimen  Techn ical component performed at [facility], [address], [place], [place] [address] Lab: [phone] Dir: Titus Dubin. Cato Mulligan, MD  Professional component performed at [facility], [facility], [address] [place], [place], [place] [address] Lab: [phone] Dir: Georgiann Cocker. Oneita Kras, MD     Dg Hand Complete Right  Result Date: 01/01/2018 CLINICAL DATA:  22 y/o M; punched a door frame. Fifth metacarpal pain and swelling. EXAM: RIGHT HAND - COMPLETE 3+ VIEW COMPARISON:  None. FINDINGS: Acute fifth distal metacarpal extra-articular fracture with minimal displacement and dorsal apex angulation. Minimally displaced acute fracture of second distal phalanx tuft. No other fracture identified. No joint dislocation. IMPRESSION: 1. Acute fifth distal metacarpal extra-articular fracture with minimal displacement and dorsal apex angulation. 2. Minimally displaced acute fracture of second distal phalanx tuft. Electronically Signed   By: Mitzi Hansen M.D.   On: 01/01/2018 23:55

## 2018-01-02 NOTE — ED Provider Notes (Signed)
[facility] Emergency Department Provider Note  ____________________________________________   First MD Initiated Contact with Patient 01/02/18 0121     (approximate)  I have reviewed the triage vital signs and the nursing notes.   HISTORY  Chief Complaint Hand Pain    HPI Gregory Chen is a 22 y.o. male who self presents to the emergency department with sudden onset severe right hand pain that began when he became frustrated and punched a wall earlier today.  He is right-hand dominant works at [employer].  He had sudden onset severe pain worse with movement improved with rest.  No numbness or weakness.  He does report a several-year-old injury to his index finger on the right.  Past Medical History:  Diagnosis Date  . Allergic rhinitis, seasonal   . Asthma   . H/O otitis media   . H/O status asthmaticus   . Hip pain, bilateral   . Obesity, Class I, BMI 30.0-34.9 (see actual BMI)   . Pneumonia   . Rupture of anterior cruciate ligament of left knee    sequela    Patient Active Problem List   Diagnosis Date Noted  . Allergic rhinitis, seasonal 05/10/2015  . Arthralgia of hip 05/10/2015  . Obesity (BMI 30-39.9) 05/10/2015  . ACL (anterior cruciate ligament) rupture 05/10/2015  . Current tobacco use 05/10/2015    Past Surgical History:  Procedure Laterality Date  . CIRCUMCISION REVISION  2003   pediatric  . MYRINGOTOMY WITH TUBE PLACEMENT  1997  . PILONIDAL CYST EXCISION  2012   outpatient, Dr. Lemar LivingsByrnett (twice)  . PILONIDAL CYST EXCISION N/A 06/15/2015   Procedure: CYST EXCISION PILONIDAL with flap ;  Surgeon: Earline MayotteJeffrey W Byrnett, MD;  Location: [facility] ORS;  Service: General;  Laterality: N/A;    Prior to Admission medications   Medication Sig Start Date End Date Taking? Authorizing Provider  HYDROcodone-acetaminophen (NORCO) 5-325 MG tablet Take 1 tablet by mouth every 6 (six) hours as needed for up to 7 doses for severe pain. 01/02/18    Merrily Brittleifenbark, Shanaye Rief, MD    Allergies Patient has no known allergies.  Family History  Problem Relation Age of Onset  . Allergies Mother        hay fever  . Migraines Mother   . Hypertension Mother   . Diabetes Father   . Alcohol abuse Father   . Depression Father   . Allergies Father        hay fever  . Hypertension Father   . Kidney disease Father   . Bipolar disorder Father        on dad's side  . Diabetes Maternal Grandmother   . Diabetes Maternal Grandfather   . Diabetes Paternal Grandmother   . Diabetes Paternal Grandfather     Social History Social History   Tobacco Use  . Smoking status: Former Smoker    Packs/day: 0.25    Years: 3.00    Pack years: 0.75    Start date: 05/17/2013    Last attempt to quit: 04/22/2016    Years since quitting: 1.6  . Smokeless tobacco: Former User    Types: Snuff, Dorna BloomChew    Quit date: 05/29/2015  Substance Use Topics  . Alcohol use: Yes    Alcohol/week: 0.0 oz    Comment: ocassional  . Drug use: Yes    Types: Marijuana    Comment: uses marijuana    Review of Systems Constitutional: No fever/chills ENT: No sore throat. Cardiovascular: Denies chest pain.  Respiratory: Denies shortness of breath. Gastrointestinal: No abdominal pain.  No nausea, no vomiting.  No diarrhea.  No constipation. Musculoskeletal: Negative for back pain. Neurological: Negative for headaches   ____________________________________________   PHYSICAL EXAM:  VITAL SIGNS: ED Triage Vitals  Enc Vitals Group     BP 01/01/18 2334 (!) 145/87     Pulse Rate 01/01/18 2334 77     Resp 01/01/18 2334 18     Temp 01/01/18 2334 98.7 F (37.1 C)     Temp Source 01/01/18 2334 Oral     SpO2 01/01/18 2334 99 %     Weight 01/01/18 2335 173 lb (78.5 kg)     Height --      Head Circumference --      Peak Flow --      Pain Score 01/01/18 2335 5     Pain Loc --      Pain Edu? --      Excl. in GC? --     Constitutional: Alert and oriented x4 pleasant  cooperative speaks in full clear sentences no diaphoresis Head: Atraumatic. Nose: No congestion/rhinnorhea. Mouth/Throat: No trismus Neck: No stridor.   Cardiovascular: Regular rate and rhythm Respiratory: Normal respiratory effort.  No retractions. MSK: No tenderness over distal radius or distal ulna. No tenderness over snuffbox and no axial load discomfort Quite tender over distal fifth metacarpal No scissoring No tenderness of her distal index finger Sensation intact to light touch over first dorsal webspace, distal index finger, distal small finger Can flex and oppose  thumb, cross 2 on 3, and extend wrist 2+ radial pulse and less than 2 second capillary refill Skin is closed Compartments are soft  Neurologic:  Normal speech and language. No gross focal neurologic deficits are appreciated.  Skin:  Skin is warm, dry and intact. No rash noted.    ____________________________________________  LABS (all labs ordered are listed, but only abnormal results are displayed)  Labs Reviewed - No data to display   __________________________________________  EKG   ____________________________________________  RADIOLOGY  X-ray of the hand reviewed by me consistent with minimally displaced boxer's fracture ____________________________________________   DIFFERENTIAL includes but not limited to  Boxer's fracture, wrist fracture, sprain, contusion   PROCEDURES  Procedure(s) performed: Yes  Reduction of fracture Date/Time: 01/02/2018 1:31 AM Performed by: Merrily Brittle, MD Authorized by: Merrily Brittle, MD  Consent: Verbal consent obtained. Consent given by: patient Patient understanding: patient states understanding of the procedure being performed Patient identity confirmed: verbally with patient and arm band Local anesthesia used: yes Anesthesia: hematoma block  Anesthesia: Local anesthesia used: yes Local Anesthetic: bupivacaine 0.5% without  epinephrine Anesthetic total: 3 mL  Sedation: Patient sedated: no  Patient tolerance: Patient tolerated the procedure well with no immediate complications Comments: Boxer's fracture reduced after hematoma block applied and successfully splinted.  Neurovascularly intact thereafter.  Celedonio Miyamoto Block Date/Time: 01/02/2018 1:32 AM Performed by: Merrily Brittle, MD Authorized by: Merrily Brittle, MD   Consent:    Consent obtained:  Verbal   Consent given by:  Patient   Risks discussed:  Allergic reaction, infection, nerve damage, pain and unsuccessful block Indications:    Indications:  Procedural anesthesia and pain relief Location:    Body area:  Upper extremity   Upper extremity nerve:  Metacarpal   Laterality:  Right Pre-procedure details:    Skin preparation:  2% chlorhexidine Skin anesthesia (see MAR for exact dosages):    Skin anesthesia method:  None Procedure details (see MAR for exact  dosages):    Block needle gauge:  21 G   Anesthetic injected:  Bupivacaine 0.5% w/o epi   Paresthesia:  None Post-procedure details:    Dressing:  None Comments:     Hematoma block performed using anatomical landmarks.  I used a 21-gauge needle and got a flash of blood then infused 3 cc of bupivacaine successful block.  SPLINT APPLICATION Date/Time: 5:53 AM Authorized by: Merrily Brittle Consent: Verbal consent obtained. Risks and benefits: risks, benefits and alternatives were discussed Consent given by: patient Splint applied by: ER technician Location details: Right hand Splint type: Ortho-Glass ulnar gutter Supplies used: Ortho-Glass Post-procedure: The splinted body part was neurovascularly unchanged following the procedure. Patient tolerance: Patient tolerated the procedure well with no immediate complications.     Critical Care performed: no  Observation: no ____________________________________________   INITIAL IMPRESSION / ASSESSMENT AND PLAN / ED COURSE  Pertinent  labs & imaging results that were available during my care of the patient were reviewed by me and considered in my medical decision making (see chart for details).  The patient arrives with distal right fifth metacarpal tenderness after punching a wall.  Neurovascularly intact with a scissoring.  X-ray confirms boxer's fracture.  I verbally consented the patient for hematoma block and performed using 21-gauge needle and a total of 3 cc of 0.5% bupivacaine with good pain control.  I then splinted his hand with Ortho-Glass and an ulnar gutter reducing the fracture.  Neurovascularly intact after.  Will discharge home with a short course of hydrocodone and refer to orthopedic surgery for definitive casting.  He verbalizes understanding and agreement with the plan.      ____________________________________________   FINAL CLINICAL IMPRESSION(S) / ED DIAGNOSES  Final diagnoses:  Closed boxer's fracture, initial encounter      NEW MEDICATIONS STARTED DURING THIS VISIT:  Discharge Medication List as of 01/02/2018  1:42 AM    START taking these medications   Details  HYDROcodone-acetaminophen (NORCO) 5-325 MG tablet Take 1 tablet by mouth every 6 (six) hours as needed for up to 7 doses for severe pain., Starting Sat 01/02/2018, Print         Note:  This document was prepared using Dragon voice recognition software and may include unintentional dictation errors.      Merrily Brittle, MD 01/02/18 [phone]
